# Patient Record
Sex: Male | Born: 1985 | Race: Black or African American | Hispanic: No | Smoking: Never smoker
Health system: Southern US, Community
[De-identification: ages and names within clinical notes are randomized; demographics above are authoritative.]

## PROBLEM LIST (undated history)

## (undated) HISTORY — PX: APPENDECTOMY: SHX54

---
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
## 2006-10-18 ENCOUNTER — Emergency Department (HOSPITAL_COMMUNITY): Admission: EM | Admit: 2006-10-18 | Discharge: 2006-10-18 | Payer: Self-pay | Admitting: Emergency Medicine

## 2012-03-13 ENCOUNTER — Emergency Department (HOSPITAL_COMMUNITY)
Admission: EM | Admit: 2012-03-13 | Discharge: 2012-03-13 | Disposition: A | Discharge status: Home / Self Care | Payer: Self-pay | Attending: Emergency Medicine | Admitting: Emergency Medicine

## 2012-03-13 ENCOUNTER — Emergency Department (HOSPITAL_COMMUNITY): Payer: Self-pay

## 2012-03-13 ENCOUNTER — Encounter (HOSPITAL_COMMUNITY): Payer: Self-pay | Admitting: *Deleted

## 2012-03-13 DIAGNOSIS — S41109A Unspecified open wound of unspecified upper arm, initial encounter: Secondary | ICD-10-CM | POA: Insufficient documentation

## 2012-03-13 DIAGNOSIS — M79609 Pain in unspecified limb: Secondary | ICD-10-CM | POA: Insufficient documentation

## 2012-03-13 DIAGNOSIS — R000 Tachycardia, unspecified: Secondary | ICD-10-CM | POA: Insufficient documentation

## 2012-03-13 DIAGNOSIS — S41111A Laceration without foreign body of right upper arm, initial encounter: Secondary | ICD-10-CM

## 2012-03-13 DIAGNOSIS — IMO0002 Reserved for concepts with insufficient information to code with codable children: Secondary | ICD-10-CM | POA: Insufficient documentation

## 2012-03-13 DIAGNOSIS — S41112A Laceration without foreign body of left upper arm, initial encounter: Secondary | ICD-10-CM

## 2012-03-13 DIAGNOSIS — S71009A Unspecified open wound, unspecified hip, initial encounter: Secondary | ICD-10-CM | POA: Insufficient documentation

## 2012-03-13 DIAGNOSIS — S71119A Laceration without foreign body, unspecified thigh, initial encounter: Secondary | ICD-10-CM

## 2012-03-13 DIAGNOSIS — S71109A Unspecified open wound, unspecified thigh, initial encounter: Secondary | ICD-10-CM | POA: Insufficient documentation

## 2012-03-13 MED ORDER — TETANUS-DIPHTH-ACELL PERTUSSIS 5-2.5-18.5 LF-MCG/0.5 IM SUSP
0.5000 mL | Freq: Once | INTRAMUSCULAR | Status: AC
  Administered 2012-03-13: 0.5 mL via INTRAMUSCULAR
  Filled 2012-03-13: qty 0.5

## 2012-03-13 MED ORDER — HYDROCODONE-ACETAMINOPHEN 5-325 MG PO TABS: 1.0000 | ORAL_TABLET | ORAL | Status: AC | PRN

## 2012-03-13 MED ORDER — HYDROCODONE-ACETAMINOPHEN 5-325 MG PO TABS
2.0000 | ORAL_TABLET | Freq: Once | ORAL | Status: AC
  Administered 2012-03-13: 2 via ORAL
  Filled 2012-03-13: qty 2

## 2012-03-13 NOTE — ED Notes (Signed)
Pt returned from xray.  Radiology states pt is asking to have dressing changed.

## 2012-03-13 NOTE — ED Provider Notes (Signed)
Patient with multiple lacerations initially evaluated by Dr. Oletta Lamas. Lacerations repaired as below. Xrays negative for foreign body. Discharge home.  LACERATION REPAIR Performed by: Langley Adie A Authorized by: Langley Adie A Consent: Verbal consent obtained. Risks and benefits: risks, benefits and alternatives were discussed Consent given by: patient Patient identity confirmed: provided demographic data Prepped and Draped in normal sterile fashion Wound explored  Laceration Location: right thigh  Laceration Length: 1.5cm  No Foreign Bodies seen or palpated  Anesthesia: local infiltration  Local anesthetic: lidocaine 1% w/ epinephrine  Anesthetic total: 1 ml  Irrigation method: syringe Amount of cleaning: standard  Skin closure: staple  Number of sutures: 2  Technique: staple  LACERATION REPAIR Performed by: Langley Adie A Authorized by: Langley Adie A Consent: Verbal consent obtained. Risks and benefits: risks, benefits and alternatives were discussed Consent given by: patient Patient identity confirmed: provided demographic data Prepped and Draped in normal sterile fashion Wound explored  Laceration Location: right shoulder  Laceration Length: 2cm  No Foreign Bodies seen or palpated  Anesthesia: local infiltration  Local anesthetic: lidocaine 1% w/ epinephrine  Anesthetic total: 1 ml  Irrigation method: syringe Amount of cleaning: standard  Skin closure: staple  Number of sutures: 3  Technique: staple  Patient tolerance: Patient tolerated the procedure well with no immediate complications.   Patient tolerance: Patient tolerated the procedure well with no immediate complications.   LACERATION REPAIR Performed by: Langley Adie A Authorized by: Langley Adie A Consent: Verbal consent obtained. Risks and benefits: risks, benefits and alternatives were discussed Consent given by: patient Patient identity confirmed: provided  demographic data Prepped and Draped in normal sterile fashion Wound explored  Laceration Location: right upper extremity  Laceration Length: 1cm  No Foreign Bodies seen or palpated  Anesthesia: local infiltration  Local anesthetic: lidocaine 1% w/ epinephrine  Anesthetic total: 1 ml  Irrigation method: syringe Amount of cleaning: standard  Skin closure: staple  Number of sutures: 1  Technique: staple  Patient tolerance: Patient tolerated the procedure well with no immediate complications.  LACERATION REPAIR Performed by: Langley Adie A Authorized by: Langley Adie A Consent: Verbal consent obtained. Risks and benefits: risks, benefits and alternatives were discussed Consent given by: patient Patient identity confirmed: provided demographic data Prepped and Draped in normal sterile fashion Wound explored  Laceration Location: left upper extremity  Laceration Length: 3cm  No Foreign Bodies seen or palpated  Anesthesia: local infiltration  Local anesthetic: lidocaine 1% w/ epinephrine  Anesthetic total: 2 ml  Irrigation method: syringe Amount of cleaning: standard  Skin closure: staple  Number of sutures: 4  Technique: staple  Patient tolerance: Patient tolerated the procedure well with no immediate complications.   Rodena Medin, PA-C 03/13/12 1548

## 2012-03-13 NOTE — Progress Notes (Signed)
Orthopedic Tech Progress Note Patient Details:  Gabriel Fox [birth year] [id number]  Other Ortho Devices Ortho Device Location: sling Ortho Device Interventions: Application   Cammer, Mickie Bail 03/13/2012, 4:32 PM
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
## 2012-03-13 NOTE — Discharge Instructions (Signed)
FOLLOW UP WITH YOUR DOCTOR OR RETURN HERE IN 10 DAYS FOR STAPLE REMOVAL. RETURN SOONER WITH ANY SIGN OF INFECTION. NORCO FOR PAIN.  Laceration Care, Adult A laceration is a cut or lesion that goes through all layers of the skin and into the tissue just beneath the skin. TREATMENT  Some lacerations may not require closure. Some lacerations may not be able to be closed due to an increased risk of infection. It is important to see your caregiver as soon as possible after an injury to minimize the risk of infection and maximize the opportunity for successful closure. If closure is appropriate, pain medicines may be given, if needed. The wound will be cleaned to help prevent infection. Your caregiver will use stitches (sutures), staples, wound glue (adhesive), or skin adhesive strips to repair the laceration. These tools bring the skin edges together to allow for faster healing and a better cosmetic outcome. However, all wounds will heal with a scar. Once the wound has healed, scarring can be minimized by covering the wound with sunscreen during the day for 1 full year. HOME CARE INSTRUCTIONS  For sutures or staples:  Keep the wound clean and dry.   If you were given a bandage (dressing), you should change it at least once a day. Also, change the dressing if it becomes wet or dirty, or as directed by your caregiver.   Wash the wound with soap and water 2 times a day. Rinse the wound off with water to remove all soap. Pat the wound dry with a clean towel.   After cleaning, apply a thin layer of the antibiotic ointment as recommended by your caregiver. This will help prevent infection and keep the dressing from sticking.   You may shower as usual after the first 24 hours. Do not soak the wound in water until the sutures are removed.   Only take over-the-counter or prescription medicines for pain, discomfort, or fever as directed by your caregiver.   Get your sutures or staples removed as directed by  your caregiver.  For skin adhesive strips:  Keep the wound clean and dry.   Do not get the skin adhesive strips wet. You may bathe carefully, using caution to keep the wound dry.   If the wound gets wet, pat it dry with a clean towel.   Skin adhesive strips will fall off on their own. You may trim the strips as the wound heals. Do not remove skin adhesive strips that are still stuck to the wound. They will fall off in time.  For wound adhesive:  You may briefly wet your wound in the shower or bath. Do not soak or scrub the wound. Do not swim. Avoid periods of heavy perspiration until the skin adhesive has fallen off on its own. After showering or bathing, gently pat the wound dry with a clean towel.   Do not apply liquid medicine, cream medicine, or ointment medicine to your wound while the skin adhesive is in place. This may loosen the film before your wound is healed.   If a dressing is placed over the wound, be careful not to apply tape directly over the skin adhesive. This may cause the adhesive to be pulled off before the wound is healed.   Avoid prolonged exposure to sunlight or tanning lamps while the skin adhesive is in place. Exposure to ultraviolet light in the first year will darken the scar.   The skin adhesive will usually remain in place for 5 to  10 days, then naturally fall off the skin. Do not pick at the adhesive film.  You may need a tetanus shot if:  You cannot remember when you had your last tetanus shot.   You have never had a tetanus shot.  If you get a tetanus shot, your arm may swell, get red, and feel warm to the touch. This is common and not a problem. If you need a tetanus shot and you choose not to have one, there is a rare chance of getting tetanus. Sickness from tetanus can be serious. SEEK MEDICAL CARE IF:   You have redness, swelling, or increasing pain in the wound.   You see a red line that goes away from the wound.   You have yellowish-white fluid  (pus) coming from the wound.   You have a fever.   You notice a bad smell coming from the wound or dressing.   Your wound breaks open before or after sutures have been removed.   You notice something coming out of the wound such as wood or glass.   Your wound is on your hand or foot and you cannot move a finger or toe.  SEEK IMMEDIATE MEDICAL CARE IF:   Your pain is not controlled with prescribed medicine.   You have severe swelling around the wound causing pain and numbness or a change in color in your arm, hand, leg, or foot.   Your wound splits open and starts bleeding.   You have worsening numbness, weakness, or loss of function of any joint around or beyond the wound.   You develop painful lumps near the wound or on the skin anywhere on your body.  MAKE SURE YOU:   Understand these instructions.   Will watch your condition.   Will get help right away if you are not doing well or get worse.  Document Released: 10/23/2005 Document Revised: 10/12/2011 Document Reviewed: 04/18/2011 [facility] Patient Information 2012 [place], [place].
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
## 2012-03-13 NOTE — ED Provider Notes (Signed)
  Medical screening examination/treatment/procedure(s) were conducted as a shared visit with non-physician practitioner(s) and myself.  I personally evaluated the patient during the encounter   Please see my original note.    Gabriel Fox. Jessamine Barcia, MD 03/13/12 1555
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
## 2012-04-02 ENCOUNTER — Emergency Department (INDEPENDENT_AMBULATORY_CARE_PROVIDER_SITE_OTHER)
Admission: EM | Admit: 2012-04-02 | Discharge: 2012-04-02 | Disposition: A | Discharge status: Home / Self Care | Payer: Self-pay | Source: Home / Self Care | Attending: Emergency Medicine | Admitting: Emergency Medicine

## 2012-04-02 ENCOUNTER — Encounter (HOSPITAL_COMMUNITY): Payer: Self-pay | Admitting: Emergency Medicine

## 2012-04-02 DIAGNOSIS — Z4802 Encounter for removal of sutures: Secondary | ICD-10-CM

## 2012-04-02 MED ORDER — BACITRACIN-NEOMYCIN-POLYMYXIN OINTMENT TUBE
TOPICAL_OINTMENT | Freq: Once | CUTANEOUS | Status: AC
  Administered 2012-04-02: 13:00:00 via TOPICAL

## 2012-04-02 MED ORDER — BACITRACIN ZINC 500 UNIT/GM EX OINT: TOPICAL_OINTMENT | Freq: Two times a day (BID) | CUTANEOUS | Status: AC

## 2012-04-02 NOTE — ED Notes (Signed)
Pt here for staple removal right upper arm s/p laceration 14 dys.little bleeding noted but no redness or swelling

## 2012-04-02 NOTE — ED Provider Notes (Signed)
History     CSN: [id number]  Arrival date & time 04/02/12  1109   First MD Initiated Contact with Patient 04/02/12 1116      Chief Complaint  Patient presents with  . Suture / Staple Removal    (Consider location/radiation/quality/duration/timing/severity/associated sxs/prior treatment) HPI Comments: Patient presents here approximately 14 days after having had 3 distinctive wounds repaired with staples. 2 of them on his right side, upper right shoulder, and right inner thigh area. The third repair laceration sinus left arm. Will start not draining any material and there's no increased swelling or redness around them. He waited a little longer before having them removed as per her choice, as he "wanted more days so will heal better"  Patient is a 26 y.o. male presenting with suture removal. The history is provided by the patient.  Suture / Staple Removal  The sutures were placed more than 14 days ago. There has been no drainage from the wound. There is no redness present. There is no swelling present. The pain has no pain. He has no difficulty moving the affected extremity or digit.    History reviewed. No pertinent past medical history.  History reviewed. No pertinent past surgical history.  No family history on file.  History  Substance Use Topics  . Smoking status: Not on file  . Smokeless tobacco: Not on file  . Alcohol Use: No      Review of Systems  Constitutional: Negative for fever, chills and appetite change.  Musculoskeletal: Negative for myalgias, joint swelling, arthralgias and gait problem.  Skin: Positive for wound.    Allergies  Review of patient's allergies indicates no known allergies.  Home Medications   Current Outpatient Rx  Name Route Sig Dispense Refill  . BACITRACIN ZINC 500 UNIT/GM EX OINT Topical Apply topically 2 (two) times daily. X 7 days 120 g 0  . BENADRYL ALLERGY PO Oral Take 1 capsule by mouth 2 (two) times daily as needed. allergies       BP 110/61  Pulse 78  Temp(Src) 97.5 F (36.4 C) (Oral)  Resp 14  SpO2 98%  Physical Exam  Nursing note and vitals reviewed. Constitutional: He appears well-developed and well-nourished.  Skin: Skin is warm.       ED Course  SUTURE REMOVAL Performed by: Irven Ingalsbe Authorized by: Jimmie Molly Consent: Verbal consent obtained. Written consent not obtained. Patient understanding: patient states understanding of the procedure being performed Patient identity confirmed: verbally with patient Body area: lower extremity Wound Appearance: clean Facility: sutures placed in this facility Comments: Staples removed from 3 distinctive regions. See illustration for details   (including critical care time)  Labs Reviewed - No data to display No results found.   1. Encounter for removal of sutures       MDM  Uncomplicated staple removal.        Jimmie Molly, MD 04/02/12 1410

## 2012-04-02 NOTE — ED Notes (Signed)
Bacitracin and bandaid applied to all 3 wounds, left forearm, right upper arm and right leg.

## 2012-04-02 NOTE — Discharge Instructions (Signed)
Staple Removal  Care After  The staples used to close your skin have been removed. The wound needs continued care so it can heal completely and without problems. The care described here will need to be done for another 5-10 days unless your caregiver advises otherwise.   HOME CARE INSTRUCTIONS    Keep wound site dry and clean.   If skin adhesive strips were applied after the staples were removed, they will begin to peel off in a few days. If they remain after fourteen days, they may be peeled off and discarded.   If you still have a dressing, change it at least once a day or as instructed by your caregiver. If the bandage sticks, soak it off with warm water. Pat dry with a clean towel. Look for signs of infection (see below).   Reapply cream or ointment according to your caregiver's instruction. This will help prevent infection and keep the bandage from sticking. Use of a non-stick material over the wound and under the dressing or wrap will also help keep the bandage from sticking.   If the bandage becomes wet, dirty or develops a foul smell, change it as soon as possible.   New scars become sunburned easily. Use sunscreens with protection factor (SPF) of at least 15 when out in the sun.   Only take over-the-counter or prescription medicines for pain, discomfort or fever as directed by your caregiver.  SEEK IMMEDIATE MEDICAL CARE IF:    There is redness, swelling or increasing pain in the wound.   Pus is coming from the wound.   An unexplained oral temperature above 102 F (38.9 C) develops.   You notice a foul smell coming from the wound or dressing.   There is a breaking open of the suture line (edges not staying together) of the wound edges after staples have been removed.  Document Released: 10/05/2008 Document Revised: 10/12/2011 Document Reviewed: 10/05/2008  [employer] Patient Information 2012 [employer].

## 2012-06-29 ENCOUNTER — Encounter (HOSPITAL_COMMUNITY): Payer: Self-pay | Admitting: Family Medicine

## 2012-06-29 ENCOUNTER — Emergency Department (HOSPITAL_COMMUNITY): Payer: Self-pay

## 2012-06-29 ENCOUNTER — Emergency Department (HOSPITAL_COMMUNITY)
Admission: EM | Admit: 2012-06-29 | Discharge: 2012-06-29 | Disposition: A | Discharge status: Home / Self Care | Payer: Self-pay | Attending: Emergency Medicine | Admitting: Emergency Medicine

## 2012-06-29 DIAGNOSIS — IMO0002 Reserved for concepts with insufficient information to code with codable children: Secondary | ICD-10-CM | POA: Insufficient documentation

## 2012-06-29 DIAGNOSIS — S62339A Displaced fracture of neck of unspecified metacarpal bone, initial encounter for closed fracture: Secondary | ICD-10-CM | POA: Insufficient documentation

## 2012-06-29 DIAGNOSIS — F17200 Nicotine dependence, unspecified, uncomplicated: Secondary | ICD-10-CM | POA: Insufficient documentation

## 2012-06-29 DIAGNOSIS — S62309A Unspecified fracture of unspecified metacarpal bone, initial encounter for closed fracture: Secondary | ICD-10-CM

## 2012-06-29 DIAGNOSIS — W208XXA Other cause of strike by thrown, projected or falling object, initial encounter: Secondary | ICD-10-CM | POA: Insufficient documentation

## 2012-06-29 MED ORDER — IBUPROFEN 400 MG PO TABS
800.0000 mg | ORAL_TABLET | Freq: Once | ORAL | Status: AC
  Administered 2012-06-29: 800 mg via ORAL
  Filled 2012-06-29: qty 2

## 2012-06-29 MED ORDER — OXYCODONE-ACETAMINOPHEN 5-325 MG PO TABS: 2.0000 | ORAL_TABLET | ORAL | Status: AC | PRN

## 2012-06-29 NOTE — Progress Notes (Signed)
Orthopedic Tech Progress Note Patient Details:  DRU PRIMEAU 22-Sep-1986 [id number]  Ortho Devices Type of Ortho Device: Ulna gutter splint;Arm foam sling Ortho Device/Splint Interventions: Application   Cammer, Mickie Bail 06/29/2012, 12:14 PM
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
## 2012-07-28 ENCOUNTER — Emergency Department (HOSPITAL_COMMUNITY)
Admission: EM | Admit: 2012-07-28 | Discharge: 2012-07-28 | Disposition: A | Discharge status: Home / Self Care | Payer: No Typology Code available for payment source | Attending: Emergency Medicine | Admitting: Emergency Medicine

## 2012-07-28 DIAGNOSIS — F17200 Nicotine dependence, unspecified, uncomplicated: Secondary | ICD-10-CM | POA: Insufficient documentation

## 2012-07-28 DIAGNOSIS — Z049 Encounter for examination and observation for unspecified reason: Secondary | ICD-10-CM | POA: Insufficient documentation

## 2012-07-28 DIAGNOSIS — Y92410 Unspecified street and highway as the place of occurrence of the external cause: Secondary | ICD-10-CM | POA: Insufficient documentation

## 2012-07-28 MED ORDER — OXYCODONE-ACETAMINOPHEN 5-325 MG PO TABS: 1.0000 | ORAL_TABLET | Freq: Once | ORAL | Status: DC

## 2012-07-28 MED ORDER — OXYCODONE-ACETAMINOPHEN 5-325 MG PO TABS: 1.0000 | ORAL_TABLET | Freq: Four times a day (QID) | ORAL | Status: DC | PRN

## 2012-07-28 MED ORDER — OXYCODONE-ACETAMINOPHEN 5-325 MG PO TABS
1.0000 | ORAL_TABLET | Freq: Once | ORAL | Status: AC
  Administered 2012-07-28: 1 via ORAL
  Filled 2012-07-28: qty 1

## 2012-07-28 MED ORDER — ONDANSETRON 4 MG PO TBDP: 4.0000 mg | ORAL_TABLET | Freq: Once | ORAL | Status: DC

## 2012-07-28 MED ORDER — ONDANSETRON 4 MG PO TBDP
4.0000 mg | ORAL_TABLET | Freq: Once | ORAL | Status: AC
  Administered 2012-07-28: 4 mg via ORAL
  Filled 2012-07-28: qty 1

## 2012-07-28 MED ORDER — ONDANSETRON HCL 4 MG PO TABS: 4.0000 mg | ORAL_TABLET | Freq: Four times a day (QID) | ORAL | Status: DC

## 2012-07-28 NOTE — ED Provider Notes (Signed)
Medical screening examination/treatment/procedure(s) were performed by non-physician practitioner and as supervising physician I was immediately available for consultation/collaboration.   Richardean Canal, MD 07/28/12 2146
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
## 2012-07-28 NOTE — ED Notes (Signed)
WUJ:[id number]<YN> Expected date:<BR> Expected time:<BR> Means of arrival:<BR> Comments:<BR> Hold for [facility] BB/CC
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
## 2017-03-28 ENCOUNTER — Emergency Department (HOSPITAL_COMMUNITY)
Admission: EM | Admit: 2017-03-28 | Discharge: 2017-03-28 | Disposition: A | Discharge status: Home / Self Care | Payer: Self-pay | Attending: Emergency Medicine | Admitting: Emergency Medicine

## 2017-03-28 ENCOUNTER — Encounter (HOSPITAL_COMMUNITY): Payer: Self-pay

## 2017-03-28 ENCOUNTER — Emergency Department (HOSPITAL_COMMUNITY): Payer: Self-pay

## 2017-03-28 DIAGNOSIS — S6991XA Unspecified injury of right wrist, hand and finger(s), initial encounter: Secondary | ICD-10-CM

## 2017-03-28 DIAGNOSIS — F17200 Nicotine dependence, unspecified, uncomplicated: Secondary | ICD-10-CM | POA: Insufficient documentation

## 2017-03-28 DIAGNOSIS — W268XXA Contact with other sharp object(s), not elsewhere classified, initial encounter: Secondary | ICD-10-CM | POA: Insufficient documentation

## 2017-03-28 DIAGNOSIS — Z23 Encounter for immunization: Secondary | ICD-10-CM | POA: Insufficient documentation

## 2017-03-28 DIAGNOSIS — Y92810 Car as the place of occurrence of the external cause: Secondary | ICD-10-CM | POA: Insufficient documentation

## 2017-03-28 DIAGNOSIS — Y9389 Activity, other specified: Secondary | ICD-10-CM | POA: Insufficient documentation

## 2017-03-28 DIAGNOSIS — Y999 Unspecified external cause status: Secondary | ICD-10-CM | POA: Insufficient documentation

## 2017-03-28 DIAGNOSIS — S61411A Laceration without foreign body of right hand, initial encounter: Secondary | ICD-10-CM | POA: Insufficient documentation

## 2017-03-28 MED ORDER — ACETAMINOPHEN 500 MG PO TABS
1000.0000 mg | ORAL_TABLET | Freq: Once | ORAL | Status: AC
  Administered 2017-03-28: 1000 mg via ORAL
  Filled 2017-03-28: qty 2

## 2017-03-28 MED ORDER — LIDOCAINE-EPINEPHRINE (PF) 2 %-1:200000 IJ SOLN
10.0000 mL | Freq: Once | INTRAMUSCULAR | Status: AC
  Administered 2017-03-28: 10 mL via INTRADERMAL
  Filled 2017-03-28: qty 20

## 2017-03-28 MED ORDER — TETANUS-DIPHTH-ACELL PERTUSSIS 5-2.5-18.5 LF-MCG/0.5 IM SUSP
0.5000 mL | Freq: Once | INTRAMUSCULAR | Status: AC
  Administered 2017-03-28: 0.5 mL via INTRAMUSCULAR
  Filled 2017-03-28: qty 0.5

## 2017-03-28 NOTE — ED Triage Notes (Signed)
Per Pt, Pt was working on a car when the hood of the car fell and lacerated his right hand. Bleeding controlled.

## 2017-03-28 NOTE — Discharge Instructions (Signed)
Follow-up in the ED, or urgent care or your primary care in 14 days for suture removal. Return earlier if any concerning symptoms develop. Follow-up with orthopedic hand of your pain or numbness persist. Alternate ibuprofen and Tylenol every 3 hours for the next few days for pain control. Apply ice or heat to the hand for comfort as well.
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
## 2017-03-28 NOTE — ED Notes (Signed)
Patients hand placed in iodine and sterile water solution.

## 2017-03-28 NOTE — ED Notes (Signed)
Patient transported to X-ray 

## 2017-05-23 ENCOUNTER — Emergency Department (HOSPITAL_COMMUNITY): Payer: Self-pay

## 2017-05-23 ENCOUNTER — Ambulatory Visit (HOSPITAL_COMMUNITY)
Admission: EM | Admit: 2017-05-23 | Discharge: 2017-05-23 | Disposition: A | Discharge status: Home / Self Care | Payer: Self-pay

## 2017-05-23 ENCOUNTER — Encounter (HOSPITAL_COMMUNITY): Payer: Self-pay | Admitting: Nurse Practitioner

## 2017-05-23 ENCOUNTER — Inpatient Hospital Stay (HOSPITAL_COMMUNITY)
Admission: EM | Admit: 2017-05-23 | Discharge: 2017-05-28 | DRG: 027 | Disposition: A | Discharge status: Home / Self Care | Payer: Self-pay | Attending: Neurosurgery | Admitting: Neurosurgery

## 2017-05-23 DIAGNOSIS — F17200 Nicotine dependence, unspecified, uncomplicated: Secondary | ICD-10-CM | POA: Diagnosis present

## 2017-05-23 DIAGNOSIS — R402253 Coma scale, best verbal response, oriented, at hospital admission: Secondary | ICD-10-CM | POA: Diagnosis present

## 2017-05-23 DIAGNOSIS — S065XAA Traumatic subdural hemorrhage with loss of consciousness status unknown, initial encounter: Secondary | ICD-10-CM

## 2017-05-23 DIAGNOSIS — R519 Headache, unspecified: Secondary | ICD-10-CM

## 2017-05-23 DIAGNOSIS — W010XXA Fall on same level from slipping, tripping and stumbling without subsequent striking against object, initial encounter: Secondary | ICD-10-CM | POA: Diagnosis present

## 2017-05-23 DIAGNOSIS — R402363 Coma scale, best motor response, obeys commands, at hospital admission: Secondary | ICD-10-CM | POA: Diagnosis present

## 2017-05-23 DIAGNOSIS — R51 Headache: Secondary | ICD-10-CM

## 2017-05-23 DIAGNOSIS — S065X9A Traumatic subdural hemorrhage with loss of consciousness of unspecified duration, initial encounter: Principal | ICD-10-CM | POA: Diagnosis present

## 2017-05-23 DIAGNOSIS — R402143 Coma scale, eyes open, spontaneous, at hospital admission: Secondary | ICD-10-CM | POA: Diagnosis present

## 2017-05-23 DIAGNOSIS — J020 Streptococcal pharyngitis: Secondary | ICD-10-CM | POA: Diagnosis present

## 2017-05-23 LAB — CBC
HCT: 40.5 % (ref 39.0–52.0)
Hemoglobin: 13 g/dL (ref 13.0–17.0)
MCH: 24.4 pg — ABNORMAL LOW (ref 26.0–34.0)
MCHC: 32.1 g/dL (ref 30.0–36.0)
MCV: 76 fL — ABNORMAL LOW (ref 78.0–100.0)
Platelets: 181 10*3/uL (ref 150–400)
RBC: 5.33 MIL/uL (ref 4.22–5.81)
RDW: 14.2 % (ref 11.5–15.5)
WBC: 11.8 10*3/uL — ABNORMAL HIGH (ref 4.0–10.5)

## 2017-05-23 LAB — COMPREHENSIVE METABOLIC PANEL
ALT: 24 U/L (ref 17–63)
AST: 22 U/L (ref 15–41)
Albumin: 4.3 g/dL (ref 3.5–5.0)
Alkaline Phosphatase: 50 U/L (ref 38–126)
Anion gap: 9 (ref 5–15)
BUN: 8 mg/dL (ref 6–20)
CO2: 21 mmol/L — ABNORMAL LOW (ref 22–32)
Calcium: 9.1 mg/dL (ref 8.9–10.3)
Chloride: 103 mmol/L (ref 101–111)
Creatinine, Ser: 1.17 mg/dL (ref 0.61–1.24)
GFR calc Af Amer: >60 mL/min (ref >60)
GFR calc non Af Amer: >60 mL/min (ref >60)
Glucose, Bld: 101 mg/dL — ABNORMAL HIGH (ref 65–99)
Potassium: 3.9 mmol/L (ref 3.5–5.1)
Sodium: 133 mmol/L — ABNORMAL LOW (ref 135–145)
Total Bilirubin: 1 mg/dL (ref 0.3–1.2)
Total Protein: 7.3 g/dL (ref 6.5–8.1)

## 2017-05-23 LAB — URINALYSIS, ROUTINE W REFLEX MICROSCOPIC
Bilirubin Urine: NEGATIVE
Glucose, UA: NEGATIVE mg/dL
Hgb urine dipstick: NEGATIVE
Ketones, ur: NEGATIVE mg/dL
Leukocytes, UA: NEGATIVE
Nitrite: NEGATIVE
Protein, ur: NEGATIVE mg/dL
Specific Gravity, Urine: 1.016 (ref 1.005–1.030)
pH: 5 (ref 5.0–8.0)

## 2017-05-23 MED ORDER — SODIUM CHLORIDE 0.9 % IV BOLUS (SEPSIS)
1000.0000 mL | Freq: Once | INTRAVENOUS | Status: AC
  Administered 2017-05-24: 1000 mL via INTRAVENOUS

## 2017-05-23 NOTE — ED Notes (Addendum)
Pt states he has had a headache since last night. Pt not able to get relief and had to leave work.

## 2017-05-23 NOTE — ED Notes (Signed)
Patient transported to CT 

## 2017-05-23 NOTE — ED Triage Notes (Addendum)
Pt presents with c/o headache, sore throat, diarrhea. His symptoms began when he woke this morning. He tried dayquil with no relief. He slipped and fell [place] a water park this past Sunday.

## 2017-05-24 ENCOUNTER — Inpatient Hospital Stay (HOSPITAL_COMMUNITY): Payer: Self-pay | Admitting: Certified Registered"

## 2017-05-24 ENCOUNTER — Encounter (HOSPITAL_COMMUNITY): Payer: Self-pay | Admitting: *Deleted

## 2017-05-24 ENCOUNTER — Emergency Department (HOSPITAL_COMMUNITY): Payer: Self-pay

## 2017-05-24 ENCOUNTER — Encounter (HOSPITAL_COMMUNITY)
Admission: EM | Disposition: A | Discharge status: Home / Self Care | Payer: Self-pay | Source: Home / Self Care | Attending: Neurosurgery

## 2017-05-24 DIAGNOSIS — S065XAA Traumatic subdural hemorrhage with loss of consciousness status unknown, initial encounter: Secondary | ICD-10-CM | POA: Diagnosis present

## 2017-05-24 DIAGNOSIS — S065X9A Traumatic subdural hemorrhage with loss of consciousness of unspecified duration, initial encounter: Secondary | ICD-10-CM | POA: Diagnosis present

## 2017-05-24 HISTORY — PX: BURR HOLE: SHX908

## 2017-05-24 LAB — CBC
HCT: 41.2 % (ref 39.0–52.0)
Hemoglobin: 13.1 g/dL (ref 13.0–17.0)
MCH: 24.3 pg — ABNORMAL LOW (ref 26.0–34.0)
MCHC: 31.8 g/dL (ref 30.0–36.0)
MCV: 76.4 fL — ABNORMAL LOW (ref 78.0–100.0)
Platelets: 178 10*3/uL (ref 150–400)
RBC: 5.39 MIL/uL (ref 4.22–5.81)
RDW: 14.3 % (ref 11.5–15.5)
WBC: 13.2 10*3/uL — ABNORMAL HIGH (ref 4.0–10.5)

## 2017-05-24 LAB — BASIC METABOLIC PANEL
Anion gap: 8 (ref 5–15)
BUN: 8 mg/dL (ref 6–20)
CO2: 22 mmol/L (ref 22–32)
Calcium: 9 mg/dL (ref 8.9–10.3)
Chloride: 105 mmol/L (ref 101–111)
Creatinine, Ser: 1.22 mg/dL (ref 0.61–1.24)
GFR calc Af Amer: >60 mL/min (ref >60)
GFR calc non Af Amer: >60 mL/min (ref >60)
Glucose, Bld: 129 mg/dL — ABNORMAL HIGH (ref 65–99)
Potassium: 4.6 mmol/L (ref 3.5–5.1)
Sodium: 135 mmol/L (ref 135–145)

## 2017-05-24 LAB — RAPID STREP SCREEN (MED CTR MEBANE ONLY): Streptococcus, Group A Screen (Direct): POSITIVE — AB

## 2017-05-24 SURGERY — CREATION, CRANIAL BURR HOLE
Anesthesia: General | Site: Head | Laterality: Left

## 2017-05-24 MED ORDER — LACTATED RINGERS IV SOLN
INTRAVENOUS | Status: DC
  Administered 2017-05-24: 17:00:00 via INTRAVENOUS

## 2017-05-24 MED ORDER — HYDROMORPHONE HCL 1 MG/ML IJ SOLN: 0.2500 mg | INTRAMUSCULAR | Status: DC | PRN

## 2017-05-24 MED ORDER — SUGAMMADEX SODIUM 200 MG/2ML IV SOLN
INTRAVENOUS | Status: DC | PRN
  Administered 2017-05-24: 200 mg via INTRAVENOUS

## 2017-05-24 MED ORDER — LACTATED RINGERS IV SOLN
INTRAVENOUS | Status: DC | PRN
  Administered 2017-05-24: 19:00:00 via INTRAVENOUS

## 2017-05-24 MED ORDER — ACETAMINOPHEN 160 MG/5ML PO SOLN: 650.0000 mg | ORAL | Status: DC | PRN

## 2017-05-24 MED ORDER — DOCUSATE SODIUM 100 MG PO CAPS
100.0000 mg | ORAL_CAPSULE | Freq: Two times a day (BID) | ORAL | Status: DC
  Administered 2017-05-25 – 2017-05-28 (×6): 100 mg via ORAL
  Filled 2017-05-24 (×7): qty 1

## 2017-05-24 MED ORDER — PROMETHAZINE HCL 25 MG PO TABS: 12.5000 mg | ORAL_TABLET | ORAL | Status: DC | PRN

## 2017-05-24 MED ORDER — CEFAZOLIN SODIUM-DEXTROSE 2-4 GM/100ML-% IV SOLN
INTRAVENOUS | Status: AC
  Filled 2017-05-24: qty 100

## 2017-05-24 MED ORDER — PROPOFOL 10 MG/ML IV BOLUS
INTRAVENOUS | Status: AC
  Filled 2017-05-24: qty 20

## 2017-05-24 MED ORDER — ONDANSETRON HCL 4 MG/2ML IJ SOLN: 4.0000 mg | Freq: Once | INTRAMUSCULAR | Status: DC | PRN

## 2017-05-24 MED ORDER — ONDANSETRON HCL 4 MG PO TABS: 4.0000 mg | ORAL_TABLET | ORAL | Status: DC | PRN

## 2017-05-24 MED ORDER — ACETAMINOPHEN 10 MG/ML IV SOLN
INTRAVENOUS | Status: DC | PRN
  Administered 2017-05-24: 1000 mg via INTRAVENOUS

## 2017-05-24 MED ORDER — CEFAZOLIN SODIUM-DEXTROSE 2-4 GM/100ML-% IV SOLN
2.0000 g | Freq: Three times a day (TID) | INTRAVENOUS | Status: AC
  Administered 2017-05-25 (×2): 2 g via INTRAVENOUS
  Filled 2017-05-24 (×2): qty 100

## 2017-05-24 MED ORDER — ONDANSETRON HCL 4 MG/2ML IJ SOLN
4.0000 mg | INTRAMUSCULAR | Status: DC | PRN
  Administered 2017-05-25: 4 mg via INTRAVENOUS
  Filled 2017-05-24: qty 2

## 2017-05-24 MED ORDER — ROCURONIUM BROMIDE 100 MG/10ML IV SOLN
INTRAVENOUS | Status: DC | PRN
  Administered 2017-05-24: 50 mg via INTRAVENOUS

## 2017-05-24 MED ORDER — PROPOFOL 10 MG/ML IV BOLUS
INTRAVENOUS | Status: DC | PRN
  Administered 2017-05-24: 120 mg via INTRAVENOUS

## 2017-05-24 MED ORDER — THROMBIN 5000 UNITS EX SOLR
CUTANEOUS | Status: AC
  Filled 2017-05-24: qty 10000

## 2017-05-24 MED ORDER — BACITRACIN ZINC 500 UNIT/GM EX OINT
TOPICAL_OINTMENT | CUTANEOUS | Status: AC
  Filled 2017-05-24: qty 28.35

## 2017-05-24 MED ORDER — DEXAMETHASONE SODIUM PHOSPHATE 10 MG/ML IJ SOLN
INTRAMUSCULAR | Status: DC | PRN
  Administered 2017-05-24: 10 mg via INTRAVENOUS

## 2017-05-24 MED ORDER — THROMBIN 20000 UNITS EX SOLR
CUTANEOUS | Status: AC
  Filled 2017-05-24: qty 20000

## 2017-05-24 MED ORDER — ACETAMINOPHEN 325 MG PO TABS: 650.0000 mg | ORAL_TABLET | ORAL | Status: DC | PRN

## 2017-05-24 MED ORDER — FENTANYL CITRATE (PF) 100 MCG/2ML IJ SOLN
INTRAMUSCULAR | Status: DC | PRN
  Administered 2017-05-24 (×2): 100 ug via INTRAVENOUS
  Administered 2017-05-24: 50 ug via INTRAVENOUS

## 2017-05-24 MED ORDER — DEXMEDETOMIDINE HCL 200 MCG/2ML IV SOLN
INTRAVENOUS | Status: DC | PRN
  Administered 2017-05-24 (×2): 8 ug via INTRAVENOUS

## 2017-05-24 MED ORDER — ACETAMINOPHEN 650 MG RE SUPP: 650.0000 mg | RECTAL | Status: DC | PRN

## 2017-05-24 MED ORDER — POTASSIUM CHLORIDE IN NACL 20-0.9 MEQ/L-% IV SOLN
INTRAVENOUS | Status: DC
  Administered 2017-05-24: 23:00:00 via INTRAVENOUS
  Filled 2017-05-24 (×2): qty 1000

## 2017-05-24 MED ORDER — ACETAMINOPHEN 10 MG/ML IV SOLN
INTRAVENOUS | Status: AC
  Filled 2017-05-24: qty 100

## 2017-05-24 MED ORDER — HYDROCODONE-ACETAMINOPHEN 5-325 MG PO TABS
1.0000 | ORAL_TABLET | ORAL | Status: DC | PRN
  Administered 2017-05-25 – 2017-05-28 (×6): 1 via ORAL
  Filled 2017-05-24 (×6): qty 1

## 2017-05-24 MED ORDER — LIDOCAINE-EPINEPHRINE 1 %-1:100000 IJ SOLN
INTRAMUSCULAR | Status: DC | PRN
  Administered 2017-05-24: 5 mL via INTRADERMAL

## 2017-05-24 MED ORDER — ONDANSETRON HCL 4 MG/2ML IJ SOLN
INTRAMUSCULAR | Status: DC | PRN
  Administered 2017-05-24: 4 mg via INTRAVENOUS

## 2017-05-24 MED ORDER — SODIUM CHLORIDE 0.9 % IV SOLN: INTRAVENOUS | Status: DC

## 2017-05-24 MED ORDER — VANCOMYCIN HCL 1000 MG IV SOLR
INTRAVENOUS | Status: AC
  Filled 2017-05-24: qty 1000

## 2017-05-24 MED ORDER — 0.9 % SODIUM CHLORIDE (POUR BTL) OPTIME
TOPICAL | Status: DC | PRN
  Administered 2017-05-24 (×3): 1000 mL

## 2017-05-24 MED ORDER — PENICILLIN G BENZATHINE 1200000 UNIT/2ML IM SUSP
1.2000 10*6.[IU] | Freq: Once | INTRAMUSCULAR | Status: AC
  Administered 2017-05-24: 1.2 10*6.[IU] via INTRAMUSCULAR
  Filled 2017-05-24: qty 2

## 2017-05-24 MED ORDER — MIDAZOLAM HCL 5 MG/5ML IJ SOLN
INTRAMUSCULAR | Status: DC | PRN
  Administered 2017-05-24: 2 mg via INTRAVENOUS

## 2017-05-24 MED ORDER — FENTANYL CITRATE (PF) 250 MCG/5ML IJ SOLN
INTRAMUSCULAR | Status: AC
  Filled 2017-05-24: qty 5

## 2017-05-24 MED ORDER — HYDROMORPHONE HCL 1 MG/ML IJ SOLN
0.5000 mg | INTRAMUSCULAR | Status: DC | PRN
  Administered 2017-05-24 – 2017-05-25 (×4): 0.5 mg via INTRAVENOUS
  Administered 2017-05-25: 1 mg via INTRAVENOUS
  Filled 2017-05-24: qty 1
  Filled 2017-05-24 (×4): qty 0.5

## 2017-05-24 MED ORDER — LIDOCAINE-EPINEPHRINE 1 %-1:100000 IJ SOLN
INTRAMUSCULAR | Status: AC
  Filled 2017-05-24: qty 1

## 2017-05-24 MED ORDER — CEFAZOLIN SODIUM-DEXTROSE 2-3 GM-% IV SOLR
INTRAVENOUS | Status: DC | PRN
  Administered 2017-05-24: 2 g via INTRAVENOUS

## 2017-05-24 MED ORDER — SODIUM CHLORIDE 0.9 % IR SOLN
Status: DC | PRN
  Administered 2017-05-24: 500 mL

## 2017-05-24 MED ORDER — ROCURONIUM BROMIDE 50 MG/5ML IV SOLN
INTRAVENOUS | Status: AC
  Filled 2017-05-24: qty 3

## 2017-05-24 MED ORDER — SENNOSIDES-DOCUSATE SODIUM 8.6-50 MG PO TABS
1.0000 | ORAL_TABLET | Freq: Two times a day (BID) | ORAL | Status: DC
  Administered 2017-05-25 – 2017-05-28 (×6): 1 via ORAL
  Filled 2017-05-24 (×7): qty 1

## 2017-05-24 MED ORDER — MORPHINE SULFATE (PF) 4 MG/ML IV SOLN
1.0000 mg | INTRAVENOUS | Status: DC | PRN
Start: 2017-05-24 — End: 2017-05-24

## 2017-05-24 MED ORDER — LIDOCAINE HCL (CARDIAC) 20 MG/ML IV SOLN
INTRAVENOUS | Status: DC | PRN
  Administered 2017-05-24: 100 mg via INTRAVENOUS

## 2017-05-24 MED ORDER — MEPERIDINE HCL 25 MG/ML IJ SOLN: 6.2500 mg | INTRAMUSCULAR | Status: DC | PRN

## 2017-05-24 MED ORDER — GADOBENATE DIMEGLUMINE 529 MG/ML IV SOLN
15.0000 mL | Freq: Once | INTRAVENOUS | Status: AC | PRN
  Administered 2017-05-24: 15 mL via INTRAVENOUS

## 2017-05-24 MED ORDER — MIDAZOLAM HCL 2 MG/2ML IJ SOLN
INTRAMUSCULAR | Status: AC
  Filled 2017-05-24: qty 2

## 2017-05-24 MED ORDER — PANTOPRAZOLE SODIUM 40 MG IV SOLR
40.0000 mg | Freq: Every day | INTRAVENOUS | Status: DC
  Filled 2017-05-24: qty 40

## 2017-05-24 MED ORDER — THROMBIN 20000 UNITS EX SOLR
CUTANEOUS | Status: DC | PRN
  Administered 2017-05-24: 20 mL via TOPICAL

## 2017-05-24 MED ORDER — TRAMADOL HCL 50 MG PO TABS: 50.0000 mg | ORAL_TABLET | Freq: Four times a day (QID) | ORAL | Status: DC | PRN

## 2017-05-24 MED ORDER — PHENYLEPHRINE HCL 10 MG/ML IJ SOLN
INTRAMUSCULAR | Status: DC | PRN
  Administered 2017-05-24 (×2): 80 ug via INTRAVENOUS
  Administered 2017-05-24: 120 ug via INTRAVENOUS
  Administered 2017-05-24: 80 ug via INTRAVENOUS

## 2017-05-24 SURGICAL SUPPLY — 76 items
BAG DECANTER FOR FLEXI CONT (MISCELLANEOUS) ×2 IMPLANT
BANDAGE GAUZE 4  KLING STR (GAUZE/BANDAGES/DRESSINGS) IMPLANT
BLADE CLIPPER SURG (BLADE) ×2 IMPLANT
BLADE SURG 11 STRL SS (BLADE) ×2 IMPLANT
BNDG COHESIVE 4X5 TAN NS LF (GAUZE/BANDAGES/DRESSINGS) IMPLANT
BUR ACORN 9.0 PRECISION (BURR) ×2 IMPLANT
CANISTER SUCT 3000ML PPV (MISCELLANEOUS) ×2 IMPLANT
CARTRIDGE OIL MAESTRO DRILL (MISCELLANEOUS) ×1 IMPLANT
CATH ROBINSON RED A/P 12FR (CATHETERS) ×2 IMPLANT
CLIP VESOCCLUDE MED 6/CT (CLIP) IMPLANT
CORD BIPOLAR FORCEPS 12FT (ELECTRODE) ×2 IMPLANT
DECANTER SPIKE VIAL GLASS SM (MISCELLANEOUS) ×2 IMPLANT
DERMABOND ADVANCED (GAUZE/BANDAGES/DRESSINGS) ×1
DERMABOND ADVANCED .7 DNX12 (GAUZE/BANDAGES/DRESSINGS) ×1 IMPLANT
DIFFUSER DRILL AIR PNEUMATIC (MISCELLANEOUS) ×2 IMPLANT
DRAPE NEUROLOGICAL W/INCISE (DRAPES) ×2 IMPLANT
DRAPE SURG 17X23 STRL (DRAPES) IMPLANT
DRAPE WARM FLUID 44X44 (DRAPE) ×2 IMPLANT
DRSG OPSITE POSTOP 3X4 (GAUZE/BANDAGES/DRESSINGS) ×4 IMPLANT
ELECT CAUTERY BLADE 6.4 (BLADE) ×2 IMPLANT
ELECT REM PT RETURN 9FT ADLT (ELECTROSURGICAL) ×2
ELECTRODE REM PT RTRN 9FT ADLT (ELECTROSURGICAL) ×1 IMPLANT
GAUZE SPONGE 4X4 12PLY STRL (GAUZE/BANDAGES/DRESSINGS) IMPLANT
GAUZE SPONGE 4X4 16PLY XRAY LF (GAUZE/BANDAGES/DRESSINGS) IMPLANT
GLOVE BIO SURGEON STRL SZ 6.5 (GLOVE) IMPLANT
GLOVE BIO SURGEON STRL SZ7 (GLOVE) IMPLANT
GLOVE BIO SURGEON STRL SZ7.5 (GLOVE) IMPLANT
GLOVE BIO SURGEON STRL SZ8 (GLOVE) ×2 IMPLANT
GLOVE BIO SURGEON STRL SZ8.5 (GLOVE) IMPLANT
GLOVE BIOGEL M 8.0 STRL (GLOVE) ×2 IMPLANT
GLOVE BIOGEL PI IND STRL 7.0 (GLOVE) IMPLANT
GLOVE BIOGEL PI INDICATOR 7.0 (GLOVE)
GLOVE ECLIPSE 6.5 STRL STRAW (GLOVE) IMPLANT
GLOVE ECLIPSE 7.0 STRL STRAW (GLOVE) IMPLANT
GLOVE ECLIPSE 7.5 STRL STRAW (GLOVE) IMPLANT
GLOVE ECLIPSE 8.0 STRL XLNG CF (GLOVE) IMPLANT
GLOVE ECLIPSE 8.5 STRL (GLOVE) IMPLANT
GLOVE EXAM NITRILE LRG STRL (GLOVE) IMPLANT
GLOVE EXAM NITRILE XL STR (GLOVE) IMPLANT
GLOVE EXAM NITRILE XS STR PU (GLOVE) IMPLANT
GLOVE INDICATOR 6.5 STRL GRN (GLOVE) IMPLANT
GLOVE INDICATOR 7.0 STRL GRN (GLOVE) IMPLANT
GLOVE INDICATOR 7.5 STRL GRN (GLOVE) IMPLANT
GLOVE INDICATOR 8.0 STRL GRN (GLOVE) ×2 IMPLANT
GLOVE INDICATOR 8.5 STRL (GLOVE) ×2 IMPLANT
GLOVE OPTIFIT SS 8.0 STRL (GLOVE) IMPLANT
GLOVE SURG SS PI 6.5 STRL IVOR (GLOVE) IMPLANT
GOWN STRL REUS W/ TWL LRG LVL3 (GOWN DISPOSABLE) ×1 IMPLANT
GOWN STRL REUS W/ TWL XL LVL3 (GOWN DISPOSABLE) ×1 IMPLANT
GOWN STRL REUS W/TWL 2XL LVL3 (GOWN DISPOSABLE) ×2 IMPLANT
GOWN STRL REUS W/TWL LRG LVL3 (GOWN DISPOSABLE) ×1
GOWN STRL REUS W/TWL XL LVL3 (GOWN DISPOSABLE) ×1
HEMOSTAT SURGICEL 2X14 (HEMOSTASIS) IMPLANT
HOOK DURA (MISCELLANEOUS) IMPLANT
KIT BASIN OR (CUSTOM PROCEDURE TRAY) ×2 IMPLANT
KIT ROOM TURNOVER OR (KITS) ×2 IMPLANT
NEEDLE HYPO 25X1 1.5 SAFETY (NEEDLE) ×2 IMPLANT
NS IRRIG 1000ML POUR BTL (IV SOLUTION) ×2 IMPLANT
OIL CARTRIDGE MAESTRO DRILL (MISCELLANEOUS) ×2
PACK CRANIOTOMY (CUSTOM PROCEDURE TRAY) ×2 IMPLANT
PAD ARMBOARD 7.5X6 YLW CONV (MISCELLANEOUS) ×6 IMPLANT
PATTIES SURGICAL .25X.25 (GAUZE/BANDAGES/DRESSINGS) IMPLANT
PATTIES SURGICAL .5 X.5 (GAUZE/BANDAGES/DRESSINGS) IMPLANT
PATTIES SURGICAL .5 X3 (DISPOSABLE) IMPLANT
PATTIES SURGICAL 1X1 (DISPOSABLE) IMPLANT
PIN MAYFIELD SKULL DISP (PIN) IMPLANT
SPONGE NEURO XRAY DETECT 1X3 (DISPOSABLE) IMPLANT
SPONGE SURGIFOAM ABS GEL 100 (HEMOSTASIS) IMPLANT
STAPLER VISISTAT 35W (STAPLE) ×2 IMPLANT
SUT NURALON 4 0 TR CR/8 (SUTURE) ×4 IMPLANT
SUT VIC AB 2-0 CT1 18 (SUTURE) ×2 IMPLANT
SYR CONTROL 10ML LL (SYRINGE) ×2 IMPLANT
TOWEL GREEN STERILE (TOWEL DISPOSABLE) ×2 IMPLANT
TOWEL GREEN STERILE FF (TOWEL DISPOSABLE) ×2 IMPLANT
TRAY FOLEY W/METER SILVER 16FR (SET/KITS/TRAYS/PACK) IMPLANT
WATER STERILE IRR 1000ML POUR (IV SOLUTION) ×2 IMPLANT

## 2017-05-24 NOTE — Anesthesia Postprocedure Evaluation (Signed)
Anesthesia Post Note  Patient: Gabriel Fox  Procedure(s) Performed: Procedure(s) (LRB): BURR HOLE Craniectomy (Left)     Patient location during evaluation: PACU Anesthesia Type: General Level of consciousness: awake and alert Pain management: pain level controlled Vital Signs Assessment: post-procedure vital signs reviewed and stable Respiratory status: spontaneous breathing, nonlabored ventilation, respiratory function stable and patient connected to nasal cannula oxygen Cardiovascular status: blood pressure returned to baseline and stable Postop Assessment: no signs of nausea or vomiting Anesthetic complications: no    Last Vitals:  Vitals:   05/24/17 2030 05/24/17 2045  BP:    Pulse: 89 86  Resp: 17 18  Temp:      Last Pain:  Vitals:   05/24/17 1643  TempSrc: Oral  PainSc:                  OSSEY,KEVIN DAVID

## 2017-05-24 NOTE — Progress Notes (Signed)
Patient ID: Gabriel Fox, male   DOB: 08/27/1986, 31 y.o.   MRN: [id number] Patient doing well this morning. Results of MRI scan are consistent with hematoma not consistent with empyema. Patient does have a more remote history of a head trauma at work 1-2 weeks ago feel this is most likely the source of the subacute nature of the subdural. I've gone over the risks and benefits of left-sided bur hole evacuation of a subdural hematoma extensive plantar explained perioperative course expectations of outcome alternatives of surgery and he understands and agrees to proceed forward. Neurologic exam remains intact

## 2017-05-24 NOTE — Addendum Note (Signed)
Addendum  created 05/24/17 2057 by Tillman AbideHawkins, Joshua B, CRNA   Anesthesia Intra Meds edited

## 2017-05-24 NOTE — Op Note (Signed)
Pre-operative diagnosis: Left parietal subacute subdural hematoma  Postoperative diagnosis: Same  Procedure: Left burr hole craniectomy for evacuation of a left subacute subdural hematoma  Surgeon: Jillyn HiddenGary cram  Anesthesia: Gen.  EBL: Minimal  Specimen: Cultures sent the fluid  HPI: 31 year old gentleman presented emergent last night with report of a head injury on Monday and also one may be more remote workup with CT scan showed a subacute fluid collection patient also had fever and was felt this could possibly represent empyema patient underwent MRI scan which did not show any pathologic imaging characteristics consistent with empyema looked more like subacute subdural hematoma. Patient also tested positive for strep. However the fluid collection was causing mass effect local on that part of the hemisphere and in this setting with mass effect very unusual subacute subdural hematoma the patient his age I recommended burr hole evacuation. I extensively went over the risks benefits perioperative course expectations of outcome of her surgery patient understood and agreed to proceed forward.  Operative procedure: Was discharged on general anesthesia positioned supine the neck turned the right side with a shoulder bump under his left shoulder exposing the left side of his frontoparietal skull. I shaved a tractor superiorly the superior temporal line and dry 2 incisions one frontally 1 parietally after infiltration of 5 mL lidocaine with epi these incisions were made 2 bur holes were drilled the dura was coagulated and incised in a cruciate fashion. As I opened up the parietal bur hole and did take cultures of the fluid of fluid was very dark but it was also very cloudy and did not appear like sterile old blood. I opened up the frontal burr hole and evacuated similar appearing fluid also identified a membrane on the cortical surface that I opened up to create medication of the fluid. Copiously irrigated  between bur holes until the irrigant was clear. Tended to pass a red rubber catheter however this did not pass easily so I did not force it. I then closed the parietal burr hole with interrupted Vicryl and staples irrigated to the frontal burr hole close this in the same fashion. Wounds were dressed patient recovered in stable condition. At the end the case all needle and needle counts sponge counts were correct.

## 2017-05-24 NOTE — ED Provider Notes (Signed)
[facility] DEPT Provider Note   CSN: [id number] Arrival date & time: 05/23/17  1849     History   Chief Complaint Chief Complaint  Patient presents with  . Headache    HPI Gabriel Fox is a 31 y.o. male.  HPI Patient presents to the emergency department with headache since yesterday.  The patient states she has also had sore throat, fever and cough.  Patient states that the symptoms started this morning.  Patient states that nothing seems make the condition better or worse.  She states he took some DayQuil without significant relief of his symptoms. The patient denies chest pain, shortness of breath, blurred vision, neck pain, weakness, numbness, dizziness, anorexia, edema, abdominal pain, nausea, vomiting, diarrhea, rash, back pain, dysuria, hematemesis, bloody stool, near syncope, or syncope.  Patient did mention that he fell and hit his head at [facility] park on Sunday.  History reviewed. No pertinent past medical history.  There are no active problems to display for this patient.   History reviewed. No pertinent surgical history.     Home Medications    Prior to Admission medications   Medication Sig Start Date End Date Taking? Authorizing Provider  ondansetron (ZOFRAN) 4 MG tablet Take 1 tablet (4 mg total) by mouth every 6 (six) hours. 07/28/12   Marlon Pel, PA-C  oxyCODONE-acetaminophen (PERCOCET/ROXICET) 5-325 MG per tablet Take 1 tablet by mouth every 6 (six) hours as needed for pain. 07/28/12   Marlon Pel, PA-C    Family History History reviewed. No pertinent family history.  Social History Social History  Substance Use Topics  . Smoking status: Current Every Day Smoker  . Smokeless tobacco: Never Used  . Alcohol use Yes     Comment: denies     Allergies   Patient has no known allergies.   Review of Systems Review of Systems All other systems negative except as documented in the HPI. All pertinent positives and negatives as  reviewed in the HPI.  Physical Exam Updated Vital Signs BP (!) 102/53   Pulse 90   Temp 99.8 F (37.7 C) (Oral)   Resp 18   SpO2 100%   Physical Exam  Constitutional: He is oriented to person, place, and time. He appears well-developed and well-nourished. No distress.  HENT:  Head: Normocephalic and atraumatic.  Mouth/Throat: Oropharynx is clear and moist.  Eyes: Pupils are equal, round, and reactive to light.  Neck: Normal range of motion. Neck supple.  Cardiovascular: Normal rate, regular rhythm and normal heart sounds.  Exam reveals no gallop and no friction rub.   No murmur heard. Pulmonary/Chest: Effort normal and breath sounds normal. No respiratory distress. He has no wheezes.  Abdominal: Soft. Bowel sounds are normal. He exhibits no distension. There is no tenderness.  Neurological: He is alert and oriented to person, place, and time. He has normal strength. No sensory deficit. He exhibits normal muscle tone. Coordination and gait normal. GCS eye subscore is 4. GCS verbal subscore is 5. GCS motor subscore is 6.  Skin: Skin is warm and dry. Capillary refill takes less than 2 seconds. No rash noted. No erythema.  Psychiatric: He has a normal mood and affect. His behavior is normal.  Nursing note and vitals reviewed.    ED Treatments / Results  Labs (all labs ordered are listed, but only abnormal results are displayed) Labs Reviewed  RAPID STREP SCREEN (NOT AT [facility]) - Abnormal; Notable for the following:       Result  Value   Streptococcus, Group A Screen (Direct) POSITIVE (*)    All other components within normal limits  COMPREHENSIVE METABOLIC PANEL - Abnormal; Notable for the following:    Sodium 133 (*)    CO2 21 (*)    Glucose, Bld 101 (*)    All other components within normal limits  CBC - Abnormal; Notable for the following:    WBC 11.8 (*)    MCV 76.0 (*)    MCH 24.4 (*)    All other components within normal limits  URINALYSIS, ROUTINE W REFLEX MICROSCOPIC      EKG  EKG Interpretation None       Radiology No results found.  Procedures Procedures (including critical care time)  Medications Ordered in ED Medications  sodium chloride 0.9 % bolus 1,000 mL (1,000 mLs Intravenous New Bag/Given 05/24/17 0007)     Initial Impression / Assessment and Plan / ED Course  I have reviewed the triage vital signs and the nursing notes.  Pertinent labs & imaging results that were available during my care of the patient were reviewed by me and considered in my medical decision making (see chart for details).     I received a call from the radiologist about the patient's head CT showed an abnormality in the left hemisphere.  He requested to get MRI to further delineate the cause of this abnormality.  His thoughts were that it blood versus infection.  With the recent trauma.  Does seem more likely to be blood.   Final Clinical Impressions(s) / ED Diagnoses   Final diagnoses:  None    New Prescriptions New Prescriptions   No medications on file     Charlestine NightLawyer, Christopher, Cordelia Poche-C 05/24/17 0046    Benjiman CorePickering, Nathan, MD 05/24/17 1524

## 2017-05-24 NOTE — Progress Notes (Signed)
Pt admitted to [facility] from ER at this time.  Pt denies pain/headache.  States received "a shot" in ER.  Family at bedside.  Pt has been NPO since 9 a.m per ER RN.  Called OR and they will obtain consent.  Will submit PCR screen.

## 2017-05-24 NOTE — ED Provider Notes (Signed)
  [facility].  Please see his complete H&P.  Gabriel Fox is a 31 y.o. male presents with strep throat, fever and tachycardia. Patient also complaining of headache, but pt reports ground level fall on Sunday.  CT with abnormality concerning for hemorrhage versus empyema.  Pt with normal neuro exam on initial provider's exam.  Pt will need MRI.   Physical Exam  BP 121/87   Pulse (!) 102   Temp 99.8 F (37.7 C) (Oral)   Resp 18   SpO2 100%   Physical Exam  Constitutional: He appears well-developed and well-nourished. No distress.  HENT:  Head: Normocephalic.  Eyes: Conjunctivae are normal. No scleral icterus.  Neck: Normal range of motion.  Cardiovascular: Normal rate and intact distal pulses.   Pulmonary/Chest: Effort normal.  Musculoskeletal: Normal range of motion.  Neurological: He is alert.  Skin: Skin is warm and dry.  Nursing note and vitals reviewed.   ED Course  Procedures  Clinical Course as of May 24 525  Thu May 24, 2017  0110 Plan: MRI pending.  Neurosurgery PA aware of patient.    [HM]  0500 MRI with subacute subdural hematoma  [HM]  0514 Discussed with Trinidad CuretKim, PA-C of neurosurgery who will admit the patient  [HM]  0525 Pt remains neurologically intact  [HM]    Clinical Course User Index [HM] Muthersbaugh, Boyd KerbsHannah, PA-C     MDM  Patient with subdural hematoma on MRI. Patient will need neurosurgery consult and admission. They're not advising emergent surgery tonight.   Subdural hematoma (HCC)  Headache - Plan: MR BRAIN W WO CONTRAST, MR BRAIN W WO CONTRAST  Strep throat    Muthersbaugh, Boyd KerbsHannah, PA-C 05/24/17 [id number]    Benjiman CorePickering, Nathan, MD 05/24/17 [id number]

## 2017-05-24 NOTE — Anesthesia Procedure Notes (Signed)
Procedure Name: Intubation Date/Time: 05/24/2017 7:19 PM Performed by: Oletta Lamas Pre-anesthesia Checklist: Patient identified, Emergency Drugs available, Suction available and Patient being monitored Patient Re-evaluated:Patient Re-evaluated prior to induction Oxygen Delivery Method: Circle System Utilized Preoxygenation: Pre-oxygenation with 100% oxygen Induction Type: IV induction Ventilation: Mask ventilation without difficulty Laryngoscope Size: Mac and 4 Grade View: Grade II Tube type: Oral Tube size: 7.5 mm Number of attempts: 1 Airway Equipment and Method: Stylet and Oral airway Placement Confirmation: ETT inserted through vocal cords under direct vision,  positive ETCO2 and breath sounds checked- equal and bilateral Secured at: 23 cm Tube secured with: Tape Dental Injury: Teeth and Oropharynx as per pre-operative assessment

## 2017-05-24 NOTE — ED Notes (Signed)
Patient reminded of NPO status.

## 2017-05-24 NOTE — ED Notes (Signed)
Patient transported to MRI 

## 2017-05-24 NOTE — Progress Notes (Signed)
Pt to OR at this time; chg bath completed. Report called.

## 2017-05-24 NOTE — H&P (Signed)
Subjective:   Patient is a 31 y.o. male seen regarding recent fall on Sunday. The patient first presented with complaints of headache and fever. Onset of symptoms was 5 days ago, gradually worsening since that time. Onset was related to a fall. The pain is described as aching and dull and occurs all day. The pain is rated moderate, and is located in the head. The symptoms have been progressive. Symptoms are exacerbated by none, and are relieved by none. History positive for trauma, details: Patient states that he slipped and fell at [place] on Sunday. Since then his headache has been progressively getting worse with N/V as well. He states that he did hit his head on a metal rack at work approximately a week and a half ago. Previous work up includes Ct and MRI of the head, results: 2mm thickness left convexity subdural.  History reviewed. No pertinent past medical history.  History reviewed. No pertinent surgical history.  No Known Allergies  Social History  Substance Use Topics  . Smoking status: Current Every Day Smoker  . Smokeless tobacco: Never Used  . Alcohol use Yes     Comment: denies    History reviewed. No pertinent family history. Prior to Admission medications   Not on File     Review of Systems  Positive ROS:   All other systems have been reviewed and were otherwise negative with the exception of those mentioned in the HPI and as above.  Objective: Vital signs in last 24 hours: Temp:  [99.8 F (37.7 C)] 99.8 F (37.7 C) (07/18 1854) Pulse Rate:  [39-102] 87 (07/19 0645) Resp:  [14-18] 16 (07/19 0215) BP: (81-124)/(33-87) 109/83 (07/19 0645) SpO2:  [90 %-100 %] 98 % (07/19 0645)  General Appearance: Alert, cooperative, no distress, appears stated age Head: Normocephalic, without obvious abnormality, atraumatic Eyes: PERRL, conjunctiva/corneas clear, EOM's intact, both eyes      Back: Not tested Lungs:respirations unlabored Heart: Regular rate and  rhythm Extremities: Extremities normal, atraumatic, no cyanosis or edema Pulses: not tested Skin: Skin color, texture, turgor normal  NEUROLOGIC:   Mental status: A&O x4, no aphasia, good AS, fund of knowledge and memory Motor Exam - grossly normal Sensory Exam - grossly normal Reflexes: Coordination - grossly normal Gait - not tested Balance - grossly normal Cranial Nerves: I: smell Not tested  II: visual acuity  OS: NA  OD: NA  II: visual fields Full to confrontation  II: pupils Equal, round, reactive to light  III,VII: ptosis None  III,IV,VI: extraocular muscles  Full ROM  V: mastication Normal  V: facial light touch sensation  Normal  V,VII: corneal reflex  Present  VII: facial muscle function - upper  Normal  VII: facial muscle function - lower Normal  VIII: hearing Not tested  IX: soft palate elevation  Normal  IX,X: gag reflex Present  XI: trapezius strength  5/5  XI: sternocleidomastoid strength 5/5  XI: neck flexion strength  5/5  XII: tongue strength  Normal    Data Review Lab Results  Component Value Date   WBC 11.8 (H) 05/23/2017   HGB 13.0 05/23/2017   HCT 40.5 05/23/2017   MCV 76.0 (L) 05/23/2017   PLT 181 05/23/2017   Lab Results  Component Value Date   NA 133 (L) 05/23/2017   K 3.9 05/23/2017   CL 103 05/23/2017   CO2 21 (L) 05/23/2017   BUN 8 05/23/2017   CREATININE 1.17 05/23/2017   GLUCOSE 101 (H) 05/23/2017   No  results found for: INR, PROTIME  Assessment/Plan: Pleasant 31 year old here today for headache and fever. He states that his headache got worse on Tuesday after his fall at [facility] on Sunday. He was positive for strep. He is currently being treated for that. He is neuro intact and stable. This SDH does appear to be about 87 weeks old. We will keep him NPO after 9:00am and take him for burr holes this afternoon to evacuate the hematoma. Risks and benefits were discussed with the patient and he agrees to move forward with the  surgery. Will admit to the floor before surgery and ICU post op.    Tiana Loft Meyran 05/24/2017 7:00 AM

## 2017-05-24 NOTE — Transfer of Care (Signed)
Immediate Anesthesia Transfer of Care Note  Patient: Gabriel DouglasDonald G Hult  Procedure(s) Performed: Procedure(s): BURR HOLE Craniectomy (Left)  Patient Location: PACU  Anesthesia Type:General  Level of Consciousness: drowsy, patient cooperative and responds to stimulation  Airway & Oxygen Therapy: Patient Spontanous Breathing and Patient connected to face mask oxygen  Post-op Assessment: Report given to RN and Post -op Vital signs reviewed and stable  Post vital signs: Reviewed and stable  Last Vitals:  Vitals:   05/24/17 1600 05/24/17 1643  BP: 132/75 138/80  Pulse: 85 95  Resp: 16 18  Temp: 37.9 C (!) 38.4 C    Last Pain:  Vitals:   05/24/17 1643  TempSrc: Oral  PainSc:          Complications: No apparent anesthesia complications

## 2017-05-24 NOTE — Anesthesia Preprocedure Evaluation (Signed)
Anesthesia Evaluation  Patient identified by MRN, date of birth, ID band Patient awake    Reviewed: Allergy & Precautions, NPO status , Patient's Chart, lab work & pertinent test results  Airway Mallampati: I  TM Distance: >3 FB Neck ROM: Full    Dental   Pulmonary Current Smoker,    Pulmonary exam normal        Cardiovascular Normal cardiovascular exam     Neuro/Psych    GI/Hepatic   Endo/Other    Renal/GU      Musculoskeletal   Abdominal   Peds  Hematology   Anesthesia Other Findings   Reproductive/Obstetrics                             Anesthesia Physical Anesthesia Plan  ASA: II and emergent  Anesthesia Plan: General   Post-op Pain Management:    Induction: Intravenous  PONV Risk Score and Plan: 1 and Ondansetron and Dexamethasone  Airway Management Planned: Oral ETT  Additional Equipment:   Intra-op Plan:   Post-operative Plan: Extubation in OR  Informed Consent: I have reviewed the patients History and Physical, chart, labs and discussed the procedure including the risks, benefits and alternatives for the proposed anesthesia with the patient or authorized representative who has indicated his/her understanding and acceptance.     Plan Discussed with: CRNA and Surgeon  Anesthesia Plan Comments:         Anesthesia Quick Evaluation

## 2017-05-25 ENCOUNTER — Inpatient Hospital Stay (HOSPITAL_COMMUNITY): Payer: Self-pay

## 2017-05-25 ENCOUNTER — Encounter (HOSPITAL_COMMUNITY): Payer: Self-pay | Admitting: Neurosurgery

## 2017-05-25 DIAGNOSIS — R509 Fever, unspecified: Secondary | ICD-10-CM

## 2017-05-25 DIAGNOSIS — F17210 Nicotine dependence, cigarettes, uncomplicated: Secondary | ICD-10-CM

## 2017-05-25 DIAGNOSIS — J020 Streptococcal pharyngitis: Secondary | ICD-10-CM

## 2017-05-25 DIAGNOSIS — I6200 Nontraumatic subdural hemorrhage, unspecified: Secondary | ICD-10-CM

## 2017-05-25 LAB — BASIC METABOLIC PANEL
Anion gap: 10 (ref 5–15)
BUN: 8 mg/dL (ref 6–20)
CO2: 24 mmol/L (ref 22–32)
Calcium: 9.2 mg/dL (ref 8.9–10.3)
Chloride: 105 mmol/L (ref 101–111)
Creatinine, Ser: 1.13 mg/dL (ref 0.61–1.24)
GFR calc Af Amer: >60 mL/min (ref >60)
GFR calc non Af Amer: >60 mL/min (ref >60)
Glucose, Bld: 135 mg/dL — ABNORMAL HIGH (ref 65–99)
Potassium: 4.3 mmol/L (ref 3.5–5.1)
Sodium: 139 mmol/L (ref 135–145)

## 2017-05-25 LAB — SURGICAL PCR SCREEN
MRSA, PCR: NEGATIVE
Staphylococcus aureus: NEGATIVE

## 2017-05-25 MED ORDER — PANTOPRAZOLE SODIUM 40 MG PO TBEC
40.0000 mg | DELAYED_RELEASE_TABLET | Freq: Every day | ORAL | Status: DC
  Administered 2017-05-25 – 2017-05-27 (×3): 40 mg via ORAL
  Filled 2017-05-25 (×3): qty 1

## 2017-05-25 NOTE — Progress Notes (Signed)
Patient transferred to the [facility] at this time.

## 2017-05-25 NOTE — Progress Notes (Signed)
Called MD's office to get a diet order for patient. Awaiting a call back/orders.

## 2017-05-25 NOTE — Consult Note (Signed)
[facility]       Reason for Consult: possible infection in hematoma    Referring Physician: Dr. Wynetta Emery  Active Problems:   Subdural hematoma (HCC)   SDH (subdural hematoma) (HCC)   . docusate sodium  100 mg Oral BID  . pantoprazole  40 mg Oral QHS  . senna-docusate  1 tablet Oral BID    Recommendations: Continue to observe off of antibiotics Will watch culture He has had adequate treatment for GAS pharyngitis with once dose IM penicillin  Routine HIV testing per CDC guidelines  Assessment: He has a hematoma and fever and concern for empyema and underwent burr hoe and aspiration and found to be more like hematoma.  Cultures sent and ngtd. Strep throat - already received adequate treatment  Antibiotics: Penicillin 1.2 million units x 1 dose.  HPI: Gabriel Fox is a 31 y.o. male with head injury on Monday and came in on 7/18 and CT, then MRI concerning for hematoma.  Also had strep throat.  Had some mass effect and not typical of his age to develop a subdural hematoma and so underwent bur hole and was irrigated.  No fever, no chills.  He was having a sore throat that has now resolved.  He has no complaints now.  No drainage from the area.    Review of Systems:  Constitutional: negative for fevers, chills, fatigue and malaise Gastrointestinal: negative for nausea and diarrhea Integument/breast: negative for rash All other systems reviewed and are negative    PMH: no prior medical problems  Social History  Substance Use Topics  . Smoking status: Current Every Day Smoker  . Smokeless tobacco: Never Used  . Alcohol use Yes     Comment: denies    FMH: no cardiac disease  No Known Allergies  Physical Exam: Constitutional: in no apparent distress and alert  Vitals:   05/25/17 0858 05/25/17 1232  BP:  119/65  Pulse:  79  Resp:  16  Temp: 97.8 F (36.6 C) 97.7 F (36.5 C)   EYES: anicteric ENMT:no thrush Cardiovascular: Cor RRR  and No murmurs Respiratory: CT AB; normal respiratory effort GI: Bowel sounds are normal, liver is not enlarged, spleen is not enlarged, soft, nt Musculoskeletal: no pedal edema noted Skin: no rash HENT: left scalp covered, no drainage  Lab Results  Component Value Date   WBC 13.2 (H) 05/24/2017   HGB 13.1 05/24/2017   HCT 41.2 05/24/2017   MCV 76.4 (L) 05/24/2017   PLT 178 05/24/2017    Lab Results  Component Value Date   CREATININE 1.13 05/25/2017   BUN 8 05/25/2017   NA 139 05/25/2017   K 4.3 05/25/2017   CL 105 05/25/2017   CO2 24 05/25/2017    Lab Results  Component Value Date   ALT 24 05/23/2017   AST 22 05/23/2017   ALKPHOS 50 05/23/2017     Microbiology: Recent Results (from the past 240 hour(s))  Rapid strep screen     Status: Abnormal   Collection Time: 05/24/17 12:09 AM  Result Value Ref Range Status   Streptococcus, Group A Screen (Direct) POSITIVE (A) NEGATIVE Final  Aerobic/Anaerobic Culture (surgical/deep wound)     Status: None (Preliminary result)   Collection Time: 05/24/17  8:05 PM  Result Value Ref Range Status   Specimen Description WOUND  Final   Special Requests LEFT PARIETAL FLUID FLUID ON SWAB  Final   Gram Stain   Final  MODERATE WBC PRESENT,BOTH PMN AND MONONUCLEAR NO ORGANISMS SEEN    Culture NO GROWTH < 24 HOURS  Final   Report Status PENDING  Incomplete  Surgical pcr screen     Status: None   Collection Time: 05/24/17 11:59 PM  Result Value Ref Range Status   MRSA, PCR NEGATIVE NEGATIVE Final   Staphylococcus aureus NEGATIVE NEGATIVE Final    Comment:        The Xpert SA Assay (FDA approved for NASAL specimens in patients over 31 years of age), is one component of a comprehensive surveillance program.  Test performance has been validated by [facility] for patients greater than or equal to 31 year old. It is not intended to diagnose infection nor to guide or monitor treatment.     Staci RighterOMER, Harlee Pursifull, MD [facility] [email or web address] [phone] pager  [phone] cell 05/25/2017, 4:10 PM

## 2017-05-25 NOTE — Progress Notes (Signed)
Patient is being transferred to the unit. Report called to the receiving nurse.

## 2017-05-25 NOTE — Progress Notes (Signed)
Subjective: Patient reports a mild headache but well controlled with medication. He is eating and voiding well. Denies any N/V or vision changes.   Objective: Vital signs in last 24 hours: Temp:  [97.6 F (36.4 C)-101.1 F (38.4 C)] 97.8 F (36.6 C) (07/20 0858) Pulse Rate:  [71-95] 77 (07/20 0800) Resp:  [13-26] 15 (07/20 0800) BP: (97-140)/(51-86) 98/62 (07/20 0800) SpO2:  [79 %-100 %] 100 % (07/20 0800) Weight:  [93 kg (205 lb)] 93 kg (205 lb) (07/19 2204)  Intake/Output from previous day: 07/19 0701 - 07/20 0700 In: 1959.2 [I.V.:1859.2; IV Piggyback:100] Out: 40 [Blood:40] Intake/Output this shift: Total I/O In: 75 [I.V.:75] Out: -   Neurologic: Grossly normal  Lab Results: Lab Results  Component Value Date   WBC 13.2 (H) 05/24/2017   HGB 13.1 05/24/2017   HCT 41.2 05/24/2017   MCV 76.4 (L) 05/24/2017   PLT 178 05/24/2017   No results found for: INR, PROTIME BMET Lab Results  Component Value Date   NA 139 05/25/2017   K 4.3 05/25/2017   CL 105 05/25/2017   CO2 24 05/25/2017   GLUCOSE 135 (H) 05/25/2017   BUN 8 05/25/2017   CREATININE 1.13 05/25/2017   CALCIUM 9.2 05/25/2017    Studies/Results: Ct Head Wo Contrast  Result Date: 05/25/2017 CLINICAL DATA:  Status post evacuation of extra-axial fluid collection. EXAM: CT HEAD WITHOUT CONTRAST TECHNIQUE: Contiguous axial images were obtained from the base of the skull through the vertex without intravenous contrast. COMPARISON:  Brain MRI and head CT 05/24/2017 FINDINGS: Brain: There has been evacuation of the previously seen of left convexity extra-axial collection. There is a small amount of postoperative pneumocephalus and extra-axial fluid at this site. Mass effect has resolved. There is no acute intraparenchymal hemorrhage. No hydrocephalus. Vascular: No hyperdense vessel or unexpected calcification. Skull: 2 left parietal burr holes. Sinuses/Orbits: No sinus fluid levels or advanced mucosal thickening. No  mastoid effusion. Normal orbits. IMPRESSION: Expected postoperative findings following burr hole drainage of left convexity extra-axial collection without acute abnormality. Electronically Signed   By: Deatra RobinsonKevin  Herman M.D.   On: 05/25/2017 04:31   Ct Head Wo Contrast  Result Date: 05/24/2017 CLINICAL DATA:  Headache and fever[place]. EXAM: CT HEAD WITHOUT CONTRAST TECHNIQUE: Contiguous axial images were obtained from the base of the skull through the vertex without intravenous contrast. COMPARISON:  None. FINDINGS: Brain: There is a low density left convexity subdural collection that measures up to 18 mm in thickness. There is mass effect on the left hemisphere but no midline shift. No hydrocephalus. Brain parenchyma is normal. Vascular: No hyperdense vessel or unexpected calcification. Skull: Normal visualized skull base, calvarium and extracranial soft tissues. Sinuses/Orbits: No sinus fluid levels or advanced mucosal thickening. No mastoid effusion. Normal orbits. IMPRESSION: 1. Low-density left hemispheric epidural collection. In the reported context of fever and headache, the findings could indicate the presence of a subdural empyema. With the additional history of recent head injury, a subacute hematoma is also a possibility, though the density is lower than expected given the reportedly recent trauma. MRI of the brain with and without contrast is recommended. 2. Mass effect on the left hemisphere without midline shift or hydrocephalus. Critical Value/emergent results were called by telephone at the time of interpretation on 05/24/2017 at 12:38 am to [facility] LAWYER , who verbally acknowledged these results. Electronically Signed   By: Deatra RobinsonKevin  Herman M.D.   On: 05/24/2017 00:41   Mr Brain  W Wo Contrast  Result Date: 05/24/2017 CLINICAL DATA:  Headache with recent sore throat, fever and cough. The patient fell and hit his head on 05/20/2017. EXAM: MRI HEAD WITHOUT  AND WITH CONTRAST TECHNIQUE: Multiplanar, multiecho pulse sequences of the brain and surrounding structures were obtained without and with intravenous contrast. CONTRAST:  15mL MULTIHANCE GADOBENATE DIMEGLUMINE 529 MG/ML IV SOLN COMPARISON:  Head CT 05/23/2017 FINDINGS: Brain: There is a large collection overlying the left convexity, measuring up to approximately 2 cm in thickness. The collection shows homogeneous signal, T1 hyperintense and T2 low to intermediate intensity. No hyperintensity on diffusion-weighted imaging. There is no focal diffusion restriction to indicate acute infarct. The brain parenchymal signal is normal and there is no mass lesion. There is no peripheral contrast enhancement. There is mass effect on the underlying left frontal and parietal lobes but there is no parenchymal signal change. There is mild mass effect on left lateral ventricle but no midline shift. Vascular: Major intracranial arterial and venous sinus flow voids are preserved. Skull and upper cervical spine: The visualized skull base, calvarium, upper cervical spine and extracranial soft tissues are normal. Sinuses/Orbits: No fluid levels or advanced mucosal thickening. No mastoid effusion. Normal orbits. IMPRESSION: 1. 2 cm thickness left convexity subdural collection with high T1- and intermediate-to-low T2-weighted signal without diffusion restriction or peripheral contrast enhancement is most consistent with subacute subdural hematoma, particularly in the context of recent trauma. 2. There is mass effect on the underlying brain parenchyma but no parenchymal signal change. No midline shift or hydrocephalus. Electronically Signed   By: Deatra Robinson M.D.   On: 05/24/2017 04:28    Assessment/Plan: Patient is doing well. Mild headache controlled with pain medication. Tolerating diet.  Transfer to the floor today and start ambulating with assistance. D/C home this weekend if he continues doing well.    LOS: 1 day     Tiana Loft Meyran 05/25/2017, 10:52 AM

## 2017-05-26 LAB — CBC WITH DIFFERENTIAL/PLATELET
Basophils Absolute: 0 10*3/uL (ref 0.0–0.1)
Basophils Relative: 0 %
Eosinophils Absolute: 0 10*3/uL (ref 0.0–0.7)
Eosinophils Relative: 0 %
HCT: 40.6 % (ref 39.0–52.0)
Hemoglobin: 13.2 g/dL (ref 13.0–17.0)
Lymphocytes Relative: 15 %
Lymphs Abs: 2.1 10*3/uL (ref 0.7–4.0)
MCH: 25 pg — ABNORMAL LOW (ref 26.0–34.0)
MCHC: 32.5 g/dL (ref 30.0–36.0)
MCV: 76.9 fL — ABNORMAL LOW (ref 78.0–100.0)
Monocytes Absolute: 1.3 10*3/uL — ABNORMAL HIGH (ref 0.1–1.0)
Monocytes Relative: 10 %
Neutro Abs: 10.3 10*3/uL — ABNORMAL HIGH (ref 1.7–7.7)
Neutrophils Relative %: 75 %
Platelets: 202 10*3/uL (ref 150–400)
RBC: 5.28 MIL/uL (ref 4.22–5.81)
RDW: 14.1 % (ref 11.5–15.5)
WBC: 13.8 10*3/uL — ABNORMAL HIGH (ref 4.0–10.5)

## 2017-05-26 NOTE — Progress Notes (Signed)
No growth to date on culture.  Will follow up again on Monday.  No antibiotics indicated.

## 2017-05-26 NOTE — Progress Notes (Signed)
Pt seen and examined.  No issues overnight. Minimal headache  EXAM: Temp:  [97.7 F (36.5 C)-98.2 F (36.8 C)] 98.1 F (36.7 C) (07/21 0531) Pulse Rate:  [79-85] 80 (07/21 0531) Resp:  [16-20] 20 (07/21 0531) BP: (115-129)/(65-77) 122/72 (07/21 0531) SpO2:  [98 %-99 %] 98 % (07/21 0531) Intake/Output      07/20 0701 - 07/21 0700 07/21 0701 - 07/22 0700   P.O. 240    I.V. (mL/kg) 75 (0.8)    Total Intake(mL/kg) 315 (3.4)    Net +315          Urine Occurrence 2 x     Awake and alert Follows commands throughout CN grossly intact Full strength Incision c/d/i  Plan Doing well.  Cleared from NS to go home Wait for clearance from ID Likely d/c tomorrow

## 2017-05-27 NOTE — Progress Notes (Signed)
Pt seen and examined.  No issues overnight. No concerns this am  EXAM: Temp:  [98 F (36.7 C)-98.8 F (37.1 C)] 98.8 F (37.1 C) (07/22 0501) Pulse Rate:  [87-97] 94 (07/22 0501) Resp:  [16-20] 16 (07/22 0501) BP: (113-130)/(70-100) 126/75 (07/22 0501) SpO2:  [92 %-99 %] 98 % (07/22 0501) Intake/Output      07/21 0701 - 07/22 0700   P.O. 720   Total Intake(mL/kg) 720 (7.7)   Net +720       Urine Occurrence 2 x    Awake and alert Follows commands throughout Full strength CN grossly intact Incision c/d/i  Plan Stable. Continue current care Waiting for ID clearance to d/c home

## 2017-05-28 MED ORDER — HYDROCODONE-ACETAMINOPHEN 5-325 MG PO TABS: 1.0000 | ORAL_TABLET | ORAL | 0 refills | Status: DC | PRN

## 2017-05-28 NOTE — Progress Notes (Signed)
    [facility]   Reason for visit: Follow up on hematoma  Interval History: no growth on cultures  Physical Exam: Constitutional:  Vitals:   05/28/17 0549 05/28/17 0947  BP: 128/78 125/69  Pulse: 86 81  Resp: 16 18  Temp: 99 F (37.2 C) 99 F (37.2 C)   patient appears in NAD  Impression: no new issues  Plan: 1.  No changes, if culture remains negative, no indication for any antibiotics. Will be final tomorrow night.  Ok from an ID standpoint to monitor as outpatient.  At this time, I will sign off, please call with any new issues. thanks

## 2017-05-28 NOTE — Discharge Summary (Signed)
Date of Admission: 05/23/2017  Date of Discharge: 05/28/17  Pre-operative diagnosis: Left parietal subacute subdural hematoma  Postoperative diagnosis: Same  Procedure: Left burr hole craniectomy for evacuation of a left subacute subdural hematoma  Attending: Donalee Citrinram, Gary, MD  Hospital Course:  The patient was admitted for the above listed operation and had an uncomplicated post-operative course.  They were discharged in stable condition. Cultures were followed and did not show growth  Follow up: 2 weeks  Allergies as of 05/28/2017   No Known Allergies     Medication List    TAKE these medications   HYDROcodone-acetaminophen 5-325 MG tablet Commonly known as:  NORCO/VICODIN Take 1-2 tablets by mouth every 4 (four) hours as needed for moderate pain.

## 2017-05-28 NOTE — Progress Notes (Signed)
Discharge orders received.  Discharge instructions and follow-up appointments reviewed with the patient.  VSS upon discharge.  IV removed and education complete.  All belongings sent with the patient.  Transported out via wheelchair. Silva, LaToya M, RN   

## 2017-05-28 NOTE — Care Management Note (Signed)
Case Management Note  Patient Details  Name: Gabriel Fox MRN: [id number] Date of Birth: 12/22/1985  Subjective/Objective:                    Action/Plan: Pt discharging home with self care. Pt without insurance and no PCP. CM [facility]. Patient to call and schedule an appointment. CM also informed him of [facility] with [facility].  Pt discharging on Norco. [facility] will not assist with this medication. CM provided him coupons for [facility] and [facility]. Pt states he can afford the medication with the coupons.  Pt states he has transportation home today.   Expected Discharge Date:  05/28/17               Expected Discharge Plan:  Home/Self Care  In-House Referral:     Discharge planning Services  CM Consult, Medication Assistance  Post Acute Care Choice:    Choice offered to:     DME Arranged:    DME Agency:     HH Arranged:    HH Agency:     Status of Service:  Completed, signed off  If discussed at [employer] Length of Stay Meetings, dates discussed:    Additional Comments:  Gabriel BaloKelli F Willard, RN 05/28/2017, 11:43 AM

## 2017-05-30 LAB — AEROBIC/ANAEROBIC CULTURE W GRAM STAIN (SURGICAL/DEEP WOUND): Culture: NO GROWTH

## 2017-05-30 LAB — HIV ANTIBODY (ROUTINE TESTING W REFLEX): HIV Screen 4th Generation wRfx: NONREACTIVE

## 2017-06-13 ENCOUNTER — Other Ambulatory Visit: Payer: Self-pay | Admitting: Student

## 2017-06-13 DIAGNOSIS — S065X9A Traumatic subdural hemorrhage with loss of consciousness of unspecified duration, initial encounter: Secondary | ICD-10-CM

## 2017-06-13 DIAGNOSIS — S065XAA Traumatic subdural hemorrhage with loss of consciousness status unknown, initial encounter: Secondary | ICD-10-CM

## 2017-06-22 ENCOUNTER — Ambulatory Visit
Admission: RE | Admit: 2017-06-22 | Discharge: 2017-06-22 | Disposition: A | Discharge status: Home / Self Care | Payer: No Typology Code available for payment source | Source: Ambulatory Visit | Attending: Student | Admitting: Student

## 2017-06-22 DIAGNOSIS — S065XAA Traumatic subdural hemorrhage with loss of consciousness status unknown, initial encounter: Secondary | ICD-10-CM

## 2017-06-22 DIAGNOSIS — S065X9A Traumatic subdural hemorrhage with loss of consciousness of unspecified duration, initial encounter: Secondary | ICD-10-CM

## 2017-06-27 ENCOUNTER — Other Ambulatory Visit: Payer: Self-pay | Admitting: Student

## 2017-06-27 DIAGNOSIS — S065X9A Traumatic subdural hemorrhage with loss of consciousness of unspecified duration, initial encounter: Secondary | ICD-10-CM

## 2017-08-14 ENCOUNTER — Ambulatory Visit
Admission: RE | Admit: 2017-08-14 | Discharge: 2017-08-14 | Disposition: A | Discharge status: Home / Self Care | Payer: No Typology Code available for payment source | Source: Ambulatory Visit | Attending: Student | Admitting: Student

## 2017-08-14 DIAGNOSIS — S065X9A Traumatic subdural hemorrhage with loss of consciousness of unspecified duration, initial encounter: Secondary | ICD-10-CM

## 2017-10-26 ENCOUNTER — Other Ambulatory Visit: Payer: Self-pay

## 2017-10-26 ENCOUNTER — Encounter (HOSPITAL_COMMUNITY): Payer: Self-pay | Admitting: *Deleted

## 2017-10-26 DIAGNOSIS — B9789 Other viral agents as the cause of diseases classified elsewhere: Secondary | ICD-10-CM | POA: Insufficient documentation

## 2017-10-26 DIAGNOSIS — F17200 Nicotine dependence, unspecified, uncomplicated: Secondary | ICD-10-CM | POA: Insufficient documentation

## 2017-10-26 DIAGNOSIS — J028 Acute pharyngitis due to other specified organisms: Secondary | ICD-10-CM | POA: Insufficient documentation

## 2017-10-26 NOTE — ED Triage Notes (Signed)
The pt is c/o a sore throat for 5 days getting worse today

## 2017-10-27 ENCOUNTER — Emergency Department (HOSPITAL_COMMUNITY)
Admission: EM | Admit: 2017-10-27 | Discharge: 2017-10-27 | Disposition: A | Discharge status: Home / Self Care | Payer: No Typology Code available for payment source | Attending: Emergency Medicine | Admitting: Emergency Medicine

## 2017-10-27 DIAGNOSIS — J029 Acute pharyngitis, unspecified: Secondary | ICD-10-CM

## 2017-10-27 LAB — RAPID STREP SCREEN (MED CTR MEBANE ONLY): Streptococcus, Group A Screen (Direct): NEGATIVE

## 2017-10-27 MED ORDER — DEXAMETHASONE SODIUM PHOSPHATE 10 MG/ML IJ SOLN
10.0000 mg | Freq: Once | INTRAMUSCULAR | Status: AC
Start: 2017-10-27 — End: 2017-10-27
  Administered 2017-10-27: 10 mg via INTRAMUSCULAR
  Filled 2017-10-27: qty 1

## 2017-10-27 NOTE — ED Provider Notes (Signed)
[facility] Provider Note   CSN: [id number] Arrival date & time: 10/26/17  2322     History   Chief Complaint Chief Complaint  Patient presents with  . Sore Throat    HPI Gabriel DouglasDonald G Runner is a 31 y.o. male.  The history is provided by the patient and medical records. No language interpreter was used.  Sore Throat  Pertinent negatives include no abdominal pain and no shortness of breath.   Gabriel DouglasDonald G Wotton is a 31 y.o. male  with no pertinent past medical history who presents to the Emergency Department complaining of constant, progressively worsening sore throat x5 days.  Associated symptoms include nasal congestion and dry cough.  Painful to swallow, but eating and drinking.  No fever or chills.  No known sick contacts.  No alleviating or aggravating factors.  History reviewed. No pertinent past medical history.  Patient Active Problem List   Diagnosis Date Noted  . Subdural hematoma (HCC) 05/24/2017  . SDH (subdural hematoma) (HCC) 05/24/2017    Past Surgical History:  Procedure Laterality Date  . APPENDECTOMY    . BURR HOLE Left 05/24/2017   Procedure: BURR HOLE Craniectomy;  Surgeon: Donalee Citrinram, Gary, MD;  [facility];  Service: Neurosurgery;  Laterality: Left;       Home Medications    Prior to Admission medications   Medication Sig Start Date End Date Taking? Authorizing Provider  HYDROcodone-acetaminophen (NORCO/VICODIN) 5-325 MG tablet Take 1-2 tablets by mouth every 4 (four) hours as needed for moderate pain. 05/28/17   Ditty, Loura HaltBenjamin Jared, MD    Family History No family history on file.  Social History Social History   Tobacco Use  . Smoking status: Current Every Day Smoker  . Smokeless tobacco: Never Used  Substance Use Topics  . Alcohol use: Yes    Comment: denies  . Drug use: Yes    Types: Marijuana    Comment: denies     Allergies   Patient has no known allergies.   Review of Systems Review of  Systems  Constitutional: Negative for chills and fever.  HENT: Positive for congestion and sore throat.   Respiratory: Positive for cough. Negative for shortness of breath.   Gastrointestinal: Negative for abdominal pain, diarrhea, nausea and vomiting.  Skin: Negative for rash.     Physical Exam Updated Vital Signs BP (!) 146/92 (BP Location: Right Arm)   Pulse 87   Temp 98.2 F (36.8 C) (Oral)   Resp 18   Ht 5\' 7"  (1.702 m)   Wt 93 kg (205 lb)   SpO2 99%   BMI 32.11 kg/m   Physical Exam  Constitutional: He is oriented to person, place, and time. He appears well-developed and well-nourished. No distress.  HENT:  Head: Normocephalic and atraumatic.  Oropharynx with erythema and tonsillar hypertrophy.  No exudates.  No PTA.  Tolerating secretions well.  Cardiovascular: Normal rate, regular rhythm and normal heart sounds.  No murmur heard. Pulmonary/Chest: Effort normal and breath sounds normal. No respiratory distress.  Lungs clear to auscultation bilaterally.  Abdominal: Soft. He exhibits no distension. There is no tenderness.  Musculoskeletal: He exhibits no edema.  Neurological: He is alert and oriented to person, place, and time.  Skin: Skin is warm and dry.  Nursing note and vitals reviewed.    ED Treatments / Results  Labs (all labs ordered are listed, but only abnormal results are displayed) Labs Reviewed  R[facility]ID [facility]CREEN ([facility])  CULTURE,  GROUP A STREP [facility])    EKG  EKG Interpretation None       Radiology No results found.  Procedures Procedures (including critical care time)  Medications Ordered in ED Medications  dexamethasone (DECADRON) injection 10 mg (not administered)     Initial Impression / Assessment and Plan / ED Course  I have reviewed the triage vital signs and the nursing notes.  Pertinent labs & imaging results that were available during my care of the patient were reviewed by me and considered in my medical decision  making (see chart for details).    Gabriel Fox is a 31 y.o. male who presents to ED for sore throat, cough, congestion x5 days.  On exam, patient is afebrile, hemodynamically stable with clear lung exam.  Oropharynx with erythema and tonsillar hypertrophy, but no exudates.  Handling secretions well and tolerating p.o. without difficulty.  Rapid strep negative.  Decadron given in ED. PCP follow-up if no improvement.  Symptomatic home care instructions discussed, reasons to return to ER discussed, all questions answered.   Final Clinical Impressions(s) / ED Diagnoses   Final diagnoses:  Viral pharyngitis    ED Discharge Orders    None       Ward, Chase PicketJaime Pilcher, PA-C 10/27/17 0105    Dione BoozeGlick, David, MD 10/27/17 [phone]

## 2017-10-27 NOTE — Discharge Instructions (Signed)
Alternate between Tylenol and ibuprofen as needed for pain. Gargle warm salt water and spit it out. It is very important to stay hydrated!  Follow up with your primary care doctor in 5-7 days for recheck of ongoing symptoms and return to emergency department if any new or worsening of symptoms develop or you have any additional concerns.  °

## 2017-10-27 NOTE — ED Notes (Signed)
Pt reports a sore throat that has been going on for 5 days and is only getting worse.

## 2017-10-29 LAB — CULTURE, GROUP A STREP (THRC)

## 2017-10-30 ENCOUNTER — Emergency Department (HOSPITAL_COMMUNITY)
Admission: EM | Admit: 2017-10-30 | Discharge: 2017-10-30 | Disposition: A | Discharge status: Home / Self Care | Payer: Self-pay | Attending: Emergency Medicine | Admitting: Emergency Medicine

## 2017-10-30 ENCOUNTER — Encounter (HOSPITAL_COMMUNITY): Payer: Self-pay

## 2017-10-30 ENCOUNTER — Other Ambulatory Visit: Payer: Self-pay

## 2017-10-30 ENCOUNTER — Emergency Department (HOSPITAL_COMMUNITY): Payer: Self-pay

## 2017-10-30 DIAGNOSIS — J039 Acute tonsillitis, unspecified: Secondary | ICD-10-CM | POA: Insufficient documentation

## 2017-10-30 DIAGNOSIS — F17200 Nicotine dependence, unspecified, uncomplicated: Secondary | ICD-10-CM | POA: Insufficient documentation

## 2017-10-30 DIAGNOSIS — J029 Acute pharyngitis, unspecified: Secondary | ICD-10-CM | POA: Insufficient documentation

## 2017-10-30 LAB — BASIC METABOLIC PANEL
Anion gap: 8 (ref 5–15)
BUN: 14 mg/dL (ref 6–20)
CO2: 25 mmol/L (ref 22–32)
Calcium: 9.3 mg/dL (ref 8.9–10.3)
Chloride: 104 mmol/L (ref 101–111)
Creatinine, Ser: 1.21 mg/dL (ref 0.61–1.24)
GFR calc Af Amer: >60 mL/min (ref >60)
GFR calc non Af Amer: >60 mL/min (ref >60)
Glucose, Bld: 96 mg/dL (ref 65–99)
Potassium: 3.8 mmol/L (ref 3.5–5.1)
Sodium: 137 mmol/L (ref 135–145)

## 2017-10-30 LAB — CBC
HCT: 40.1 % (ref 39.0–52.0)
Hemoglobin: 13 g/dL (ref 13.0–17.0)
MCH: 24.9 pg — ABNORMAL LOW (ref 26.0–34.0)
MCHC: 32.4 g/dL (ref 30.0–36.0)
MCV: 76.7 fL — ABNORMAL LOW (ref 78.0–100.0)
Platelets: 263 10*3/uL (ref 150–400)
RBC: 5.23 MIL/uL (ref 4.22–5.81)
RDW: 15.1 % (ref 11.5–15.5)
WBC: 7.1 10*3/uL (ref 4.0–10.5)

## 2017-10-30 LAB — RAPID STREP SCREEN (MED CTR MEBANE ONLY): Streptococcus, Group A Screen (Direct): NEGATIVE

## 2017-10-30 MED ORDER — KETOROLAC TROMETHAMINE 30 MG/ML IJ SOLN
30.0000 mg | Freq: Once | INTRAMUSCULAR | Status: AC
  Administered 2017-10-30: 30 mg via INTRAVENOUS
  Filled 2017-10-30: qty 1

## 2017-10-30 MED ORDER — HYDROCODONE-ACETAMINOPHEN 5-325 MG PO TABS: 1.0000 | ORAL_TABLET | Freq: Four times a day (QID) | ORAL | 0 refills | Status: DC | PRN

## 2017-10-30 MED ORDER — HYDROCODONE-ACETAMINOPHEN 5-325 MG PO TABS
1.0000 | ORAL_TABLET | Freq: Once | ORAL | Status: AC
  Administered 2017-10-30: 1 via ORAL
  Filled 2017-10-30: qty 1

## 2017-10-30 MED ORDER — IOPAMIDOL (ISOVUE-300) INJECTION 61%
INTRAVENOUS | Status: AC
  Administered 2017-10-30: 75 mL via INTRAVENOUS
  Filled 2017-10-30: qty 75

## 2017-10-30 MED ORDER — CLINDAMYCIN HCL 150 MG PO CAPS: 450.0000 mg | ORAL_CAPSULE | Freq: Three times a day (TID) | ORAL | 0 refills | Status: DC

## 2017-10-30 MED ORDER — SODIUM CHLORIDE 0.9 % IV BOLUS (SEPSIS)
1000.0000 mL | Freq: Once | INTRAVENOUS | Status: AC
  Administered 2017-10-30: 1000 mL via INTRAVENOUS

## 2017-10-30 NOTE — ED Notes (Signed)
Pt returned from ct

## 2017-10-30 NOTE — Discharge Instructions (Signed)
1. Medications: Vicodin only for severe pain; clindamycin (complete the entire antibiotic), usual home medications 2. Treatment: rest, drink plenty of fluids,  3. Follow Up: Please followup with your primary doctor in 2-3 days for discussion of your diagnoses and further evaluation after today's visit; if you do not have a primary care doctor use the resource guide provided to find one; Please return to the ER for worsening pain, inability to swallow, high fevers or other concerns

## 2017-10-30 NOTE — ED Triage Notes (Signed)
Pt here for swelling tonsils, seen for same a few days ago and had decadron and negative strep test.

## 2017-10-30 NOTE — ED Notes (Signed)
  Pt transported to ct 

## 2017-10-30 NOTE — ED Notes (Signed)
ED Provider at bedside. 
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
## 2017-11-01 LAB — CULTURE, GROUP A STREP (THRC)

## 2017-11-23 ENCOUNTER — Encounter (HOSPITAL_COMMUNITY): Payer: Self-pay

## 2017-11-23 ENCOUNTER — Other Ambulatory Visit: Payer: Self-pay

## 2017-11-23 DIAGNOSIS — Y999 Unspecified external cause status: Secondary | ICD-10-CM | POA: Insufficient documentation

## 2017-11-23 DIAGNOSIS — S21219A Laceration without foreign body of unspecified back wall of thorax without penetration into thoracic cavity, initial encounter: Secondary | ICD-10-CM | POA: Insufficient documentation

## 2017-11-23 DIAGNOSIS — Y939 Activity, unspecified: Secondary | ICD-10-CM | POA: Insufficient documentation

## 2017-11-23 DIAGNOSIS — Z23 Encounter for immunization: Secondary | ICD-10-CM | POA: Insufficient documentation

## 2017-11-23 DIAGNOSIS — F17200 Nicotine dependence, unspecified, uncomplicated: Secondary | ICD-10-CM | POA: Insufficient documentation

## 2017-11-23 DIAGNOSIS — Y929 Unspecified place or not applicable: Secondary | ICD-10-CM | POA: Insufficient documentation
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
## 2017-11-24 ENCOUNTER — Emergency Department (HOSPITAL_COMMUNITY)
Admission: EM | Admit: 2017-11-24 | Discharge: 2017-11-24 | Disposition: A | Discharge status: Home / Self Care | Payer: No Typology Code available for payment source | Attending: Emergency Medicine | Admitting: Emergency Medicine

## 2017-11-24 DIAGNOSIS — S21219A Laceration without foreign body of unspecified back wall of thorax without penetration into thoracic cavity, initial encounter: Secondary | ICD-10-CM

## 2017-11-24 DIAGNOSIS — T148XXA Other injury of unspecified body region, initial encounter: Secondary | ICD-10-CM

## 2017-11-24 MED ORDER — NAPROXEN 500 MG PO TABS: 500.0000 mg | ORAL_TABLET | Freq: Two times a day (BID) | ORAL | 0 refills | Status: DC

## 2017-11-24 MED ORDER — BACITRACIN ZINC 500 UNIT/GM EX OINT: 1.0000 "application " | TOPICAL_OINTMENT | Freq: Two times a day (BID) | CUTANEOUS | 0 refills | Status: DC

## 2017-11-24 MED ORDER — ACETAMINOPHEN 325 MG PO TABS: 650.0000 mg | ORAL_TABLET | Freq: Four times a day (QID) | ORAL | 0 refills | Status: DC | PRN

## 2017-11-24 MED ORDER — NAPROXEN 250 MG PO TABS
500.0000 mg | ORAL_TABLET | Freq: Once | ORAL | Status: AC
  Administered 2017-11-24: 500 mg via ORAL
  Filled 2017-11-24: qty 2

## 2017-11-24 MED ORDER — TETANUS-DIPHTH-ACELL PERTUSSIS 5-2.5-18.5 LF-MCG/0.5 IM SUSP
0.5000 mL | Freq: Once | INTRAMUSCULAR | Status: AC
  Administered 2017-11-24: 0.5 mL via INTRAMUSCULAR
  Filled 2017-11-24: qty 0.5

## 2017-11-24 MED ORDER — SULFAMETHOXAZOLE-TRIMETHOPRIM 800-160 MG PO TABS: 1.0000 | ORAL_TABLET | Freq: Two times a day (BID) | ORAL | 0 refills | Status: AC

## 2017-11-24 NOTE — ED Notes (Signed)
Dr. Nanavati at bedside 
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
## 2017-11-24 NOTE — Discharge Instructions (Signed)
We saw you in the ER for your WOUND. °Please read the instructions provided on wound care. °Keep the area clean and dry, apply bacitracin ointment daily and take the medications provided. °RETURN TO THE ER IF THERE IS INCREASED PAIN, REDNESS, PUS COMING OUT from the wound site.  °

## 2018-12-29 ENCOUNTER — Encounter (HOSPITAL_COMMUNITY): Payer: Self-pay | Admitting: Radiology

## 2018-12-29 ENCOUNTER — Emergency Department (HOSPITAL_COMMUNITY): Payer: Self-pay

## 2018-12-29 ENCOUNTER — Emergency Department (HOSPITAL_COMMUNITY)
Admission: EM | Admit: 2018-12-29 | Discharge: 2018-12-29 | Disposition: A | Discharge status: Home / Self Care | Payer: Self-pay | Attending: Emergency Medicine | Admitting: Emergency Medicine

## 2018-12-29 DIAGNOSIS — F17210 Nicotine dependence, cigarettes, uncomplicated: Secondary | ICD-10-CM | POA: Insufficient documentation

## 2018-12-29 DIAGNOSIS — Y929 Unspecified place or not applicable: Secondary | ICD-10-CM | POA: Insufficient documentation

## 2018-12-29 DIAGNOSIS — Y999 Unspecified external cause status: Secondary | ICD-10-CM | POA: Insufficient documentation

## 2018-12-29 DIAGNOSIS — S21111A Laceration without foreign body of right front wall of thorax without penetration into thoracic cavity, initial encounter: Secondary | ICD-10-CM | POA: Insufficient documentation

## 2018-12-29 DIAGNOSIS — Y939 Activity, unspecified: Secondary | ICD-10-CM | POA: Insufficient documentation

## 2018-12-29 LAB — PROTIME-INR
INR: 0.91
Prothrombin Time: 12.2 seconds (ref 11.4–15.2)

## 2018-12-29 LAB — CBC
HCT: 45.1 % (ref 39.0–52.0)
Hemoglobin: 13.4 g/dL (ref 13.0–17.0)
MCH: 24.1 pg — ABNORMAL LOW (ref 26.0–34.0)
MCHC: 29.7 g/dL — ABNORMAL LOW (ref 30.0–36.0)
MCV: 81 fL (ref 80.0–100.0)
Platelets: 214 10*3/uL (ref 150–400)
RBC: 5.57 MIL/uL (ref 4.22–5.81)
RDW: 14.9 % (ref 11.5–15.5)
WBC: 5.6 10*3/uL (ref 4.0–10.5)
nRBC: 0 % (ref 0.0–0.2)

## 2018-12-29 LAB — COMPREHENSIVE METABOLIC PANEL
ALT: 27 U/L (ref 0–44)
AST: 28 U/L (ref 15–41)
Albumin: 4 g/dL (ref 3.5–5.0)
Alkaline Phosphatase: 55 U/L (ref 38–126)
Anion gap: 8 (ref 5–15)
BUN: 11 mg/dL (ref 6–20)
CO2: 19 mmol/L — ABNORMAL LOW (ref 22–32)
Calcium: 9 mg/dL (ref 8.9–10.3)
Chloride: 110 mmol/L (ref 98–111)
Creatinine, Ser: 1.28 mg/dL — ABNORMAL HIGH (ref 0.61–1.24)
GFR calc Af Amer: >60 mL/min (ref >60)
GFR calc non Af Amer: >60 mL/min (ref >60)
Glucose, Bld: 112 mg/dL — ABNORMAL HIGH (ref 70–99)
Potassium: 3.8 mmol/L (ref 3.5–5.1)
Sodium: 137 mmol/L (ref 135–145)
Total Bilirubin: 0.7 mg/dL (ref 0.3–1.2)
Total Protein: 6.9 g/dL (ref 6.5–8.1)

## 2018-12-29 LAB — ETHANOL: Alcohol, Ethyl (B): <10 mg/dL (ref <10)

## 2018-12-29 LAB — LACTIC ACID, PLASMA: Lactic Acid, Venous: 1.8 mmol/L (ref 0.5–1.9)

## 2018-12-29 LAB — ABO/RH: ABO/RH(D): O POS

## 2018-12-29 LAB — CDS SEROLOGY

## 2018-12-29 MED ORDER — IOHEXOL 300 MG/ML  SOLN
75.0000 mL | Freq: Once | INTRAMUSCULAR | Status: AC | PRN
  Administered 2018-12-29: 75 mL via INTRAVENOUS

## 2018-12-29 MED ORDER — CEPHALEXIN 500 MG PO CAPS: 500.0000 mg | ORAL_CAPSULE | Freq: Three times a day (TID) | ORAL | 0 refills | Status: DC
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
## 2018-12-29 NOTE — Discharge Instructions (Addendum)
Staple removal in 10 days.

## 2018-12-29 NOTE — Progress Notes (Signed)
   12/29/18 1500  Clinical Encounter Type  Visited With Patient;Health care provider  No family present. Provided the ministry of presence.
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
## 2018-12-29 NOTE — ED Notes (Signed)
Patient transported to CT 
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
## 2018-12-30 LAB — TYPE AND SCREEN
ABO/RH(D): O POS
Antibody Screen: NEGATIVE
Unit division: 0
Unit division: 0

## 2018-12-30 LAB — BPAM FFP
Blood Product Expiration Date: 202003122359
Blood Product Expiration Date: 202003132359
ISSUE DATE / TIME: 202002231251
ISSUE DATE / TIME: 202002231251
Unit Type and Rh: 600
Unit Type and Rh: 6200

## 2018-12-30 LAB — PREPARE FRESH FROZEN PLASMA
Unit division: 0
Unit division: 0

## 2018-12-30 LAB — BPAM RBC
Blood Product Expiration Date: 202003052359
Blood Product Expiration Date: 202003082359
ISSUE DATE / TIME: 202002231251
ISSUE DATE / TIME: 202002231251
Unit Type and Rh: 9500
Unit Type and Rh: 9500

## 2019-01-14 ENCOUNTER — Other Ambulatory Visit: Payer: Self-pay

## 2019-01-14 ENCOUNTER — Emergency Department (HOSPITAL_COMMUNITY)
Admission: EM | Admit: 2019-01-14 | Discharge: 2019-01-14 | Disposition: A | Discharge status: Home / Self Care | Payer: Self-pay | Attending: Emergency Medicine | Admitting: Emergency Medicine

## 2019-01-14 DIAGNOSIS — Z4802 Encounter for removal of sutures: Secondary | ICD-10-CM | POA: Insufficient documentation

## 2019-01-14 DIAGNOSIS — F17200 Nicotine dependence, unspecified, uncomplicated: Secondary | ICD-10-CM | POA: Insufficient documentation

## 2019-01-14 NOTE — ED Triage Notes (Signed)
Pt in requesting x 2 staple removal to R upper chest, edges approximated, no redness, swelling, drainage or fever present or reported, A&O x4

## 2019-01-14 NOTE — Discharge Instructions (Addendum)
Return to the emergency room with any new, worsening, concerning symptoms. °
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
## 2019-04-09 ENCOUNTER — Emergency Department (HOSPITAL_COMMUNITY)
Admission: EM | Admit: 2019-04-09 | Discharge: 2019-04-09 | Disposition: A | Discharge status: Home / Self Care | Payer: HRSA Program | Attending: Emergency Medicine | Admitting: Emergency Medicine

## 2019-04-09 ENCOUNTER — Encounter (HOSPITAL_COMMUNITY): Payer: Self-pay | Admitting: *Deleted

## 2019-04-09 DIAGNOSIS — Z20828 Contact with and (suspected) exposure to other viral communicable diseases: Secondary | ICD-10-CM | POA: Insufficient documentation

## 2019-04-09 DIAGNOSIS — R197 Diarrhea, unspecified: Secondary | ICD-10-CM | POA: Diagnosis not present

## 2019-04-09 DIAGNOSIS — R531 Weakness: Secondary | ICD-10-CM | POA: Diagnosis not present

## 2019-04-09 DIAGNOSIS — R0602 Shortness of breath: Secondary | ICD-10-CM | POA: Diagnosis not present

## 2019-04-09 DIAGNOSIS — F17200 Nicotine dependence, unspecified, uncomplicated: Secondary | ICD-10-CM | POA: Insufficient documentation

## 2019-04-09 DIAGNOSIS — F1210 Cannabis abuse, uncomplicated: Secondary | ICD-10-CM | POA: Insufficient documentation

## 2019-04-09 DIAGNOSIS — Z20822 Contact with and (suspected) exposure to covid-19: Secondary | ICD-10-CM

## 2019-04-09 DIAGNOSIS — R05 Cough: Secondary | ICD-10-CM | POA: Diagnosis present

## 2019-04-09 NOTE — ED Notes (Signed)
Pt verbalized understanding of discharge paperwork and follow-up care.  °

## 2019-04-09 NOTE — Discharge Instructions (Addendum)
Please stay at home for at least 10 days from your symptoms onset AND Your symptoms are improved AND You have not had a fever for 72 hours or more

## 2019-04-09 NOTE — ED Triage Notes (Signed)
Pt in c/o cough, fatigue and body aches, reports his co-worker tested positive for Covid and he has close exposure to them, no distress noted

## 2019-04-09 NOTE — ED Provider Notes (Signed)
[facility] Provider Note   CSN: [id number] Arrival date & time: 04/09/19  1157    History   Chief Complaint Chief Complaint  Patient presents with  . Cough    HPI Sharolyn DouglasDonald G Pupo is a 33 y.o. male.     Patient is a 33 year old gentleman presenting to the emergency department for exposure to COVID-19.  Patient reports that his boss at work tested positive.  Reports that the last couple of days he has had a few episodes of diarrhea as well as feeling generalized weakness.  Denies any coughing, fever, chills, nausea, vomiting.  He does endorse some shortness of breath.  He is afraid that he might test positive.     History reviewed. No pertinent past medical history.  Patient Active Problem List   Diagnosis Date Noted  . Subdural hematoma (HCC) 05/24/2017  . SDH (subdural hematoma) (HCC) 05/24/2017    Past Surgical History:  Procedure Laterality Date  . APPENDECTOMY    . BURR HOLE Left 05/24/2017   Procedure: BURR HOLE Craniectomy;  Surgeon: Donalee Citrinram, Gary, MD;  [facility];  Service: Neurosurgery;  Laterality: Left;        Home Medications    Prior to Admission medications   Medication Sig Start Date End Date Taking? Authorizing Provider  acetaminophen (TYLENOL) 325 MG tablet Take 2 tablets (650 mg total) by mouth every 6 (six) hours as needed. 11/24/17   Derwood KaplanNanavati, Ankit, MD  bacitracin ointment Apply 1 application topically 2 (two) times daily. 11/24/17   Derwood KaplanNanavati, Ankit, MD  cephALEXin (KEFLEX) 500 MG capsule Take 1 capsule (500 mg total) by mouth 3 (three) times daily. 12/29/18   Azalia Bilisampos, Kevin, MD  clindamycin (CLEOCIN) 150 MG capsule Take 3 capsules (450 mg total) by mouth 3 (three) times daily. 10/30/17   Muthersbaugh, Dahlia ClientHannah, PA-C  HYDROcodone-acetaminophen (NORCO/VICODIN) 5-325 MG tablet Take 1 tablet by mouth every 6 (six) hours as needed for severe pain. 10/30/17   Muthersbaugh, Dahlia ClientHannah, PA-C  naproxen (NAPROSYN) 500 MG  tablet Take 1 tablet (500 mg total) by mouth 2 (two) times daily. 11/24/17   Derwood KaplanNanavati, Ankit, MD    Family History History reviewed. No pertinent family history.  Social History Social History   Tobacco Use  . Smoking status: Current Every Day Smoker  . Smokeless tobacco: Never Used  Substance Use Topics  . Alcohol use: Yes    Comment: denies  . Drug use: Yes    Types: Marijuana    Comment: denies     Allergies   Patient has no known allergies.   Review of Systems Review of Systems  Constitutional: Positive for fatigue. Negative for appetite change and fever.  HENT: Negative for ear pain.   Respiratory: Positive for shortness of breath. Negative for apnea, cough and wheezing.   Cardiovascular: Negative for chest pain and palpitations.  Gastrointestinal: Positive for diarrhea. Negative for abdominal pain, nausea and vomiting.  Genitourinary: Negative for dysuria.  Musculoskeletal: Negative for back pain.  Skin: Negative for rash and wound.  Allergic/Immunologic: Negative for immunocompromised state.  Neurological: Negative for dizziness, weakness, light-headedness and headaches.     Physical Exam Updated Vital Signs BP (!) 138/95 (BP Location: Right Arm)   Pulse 64   Temp 97.7 F (36.5 C) (Oral)   Resp 16   SpO2 98%   Physical Exam Vitals signs and nursing note reviewed.  Constitutional:      Appearance: Normal appearance.  HENT:     Head: Normocephalic.  Mouth/Throat:     Mouth: Mucous membranes are moist.     Pharynx: Oropharynx is clear.  Eyes:     Conjunctiva/sclera: Conjunctivae normal.  Cardiovascular:     Rate and Rhythm: Normal rate and regular rhythm.  Pulmonary:     Effort: Pulmonary effort is normal.     Breath sounds: Normal breath sounds.  Skin:    General: Skin is dry.  Neurological:     Mental Status: He is alert.  Psychiatric:        Mood and Affect: Mood normal.      ED Treatments / Results  Labs (all labs ordered are  listed, but only abnormal results are displayed) Labs Reviewed  NOVEL CORONAVIRUS, NAA (HOSPITAL ORDER, SEND-OUT TO REF LAB)    EKG None  Radiology No results found.  Procedures Procedures (including critical care time)  Medications Ordered in ED Medications - No data to display   Initial Impression / Assessment and Plan / ED Course  I have reviewed the triage vital signs and the nursing notes.  Pertinent labs & imaging results that were available during my care of the patient were reviewed by me and considered in my medical decision making (see chart for details).  Clinical Course as of Apr 08 1341  Wed Apr 09, 2019  1341 Suspected covid. Does not meet admission criteria. Vitals stable. Advised on return precautions and quaratine instructions   [KM]    Clinical Course User Index [KM] Arlyn Dunning, PA-C         Final Clinical Impressions(s) / ED Diagnoses   Final diagnoses:  Suspected Covid-19 Virus Infection    ED Discharge Orders    None       Arlyn Dunning, PA-C 04/09/19 1342    Charlynne Pander, MD 04/09/19 1622

## 2019-04-10 LAB — NOVEL CORONAVIRUS, NAA (HOSP ORDER, SEND-OUT TO REF LAB; TAT 18-24 HRS): SARS-CoV-2, NAA: NOT DETECTED

## 2019-11-27 ENCOUNTER — Ambulatory Visit: Payer: Self-pay | Attending: Internal Medicine

## 2019-11-27 DIAGNOSIS — Z20822 Contact with and (suspected) exposure to covid-19: Secondary | ICD-10-CM | POA: Insufficient documentation

## 2019-11-28 LAB — NOVEL CORONAVIRUS, NAA: SARS-CoV-2, NAA: NOT DETECTED

## 2019-12-08 ENCOUNTER — Ambulatory Visit: Payer: Self-pay | Attending: Internal Medicine

## 2019-12-08 DIAGNOSIS — Z20822 Contact with and (suspected) exposure to covid-19: Secondary | ICD-10-CM | POA: Insufficient documentation

## 2019-12-09 LAB — NOVEL CORONAVIRUS, NAA: SARS-CoV-2, NAA: NOT DETECTED

## 2020-02-04 ENCOUNTER — Other Ambulatory Visit: Payer: Self-pay

## 2020-02-04 ENCOUNTER — Ambulatory Visit (HOSPITAL_COMMUNITY)
Admission: EM | Admit: 2020-02-04 | Discharge: 2020-02-04 | Disposition: A | Discharge status: Home / Self Care | Payer: Self-pay | Attending: Family Medicine | Admitting: Family Medicine

## 2020-02-04 ENCOUNTER — Encounter (HOSPITAL_COMMUNITY): Payer: Self-pay

## 2020-02-04 DIAGNOSIS — R369 Urethral discharge, unspecified: Secondary | ICD-10-CM | POA: Insufficient documentation

## 2020-02-04 DIAGNOSIS — Z7251 High risk heterosexual behavior: Secondary | ICD-10-CM | POA: Insufficient documentation

## 2020-02-04 MED ORDER — LIDOCAINE HCL (PF) 1 % IJ SOLN
INTRAMUSCULAR | Status: AC
  Filled 2020-02-04: qty 2

## 2020-02-04 MED ORDER — DOXYCYCLINE HYCLATE 100 MG PO CAPS: 100.0000 mg | ORAL_CAPSULE | Freq: Two times a day (BID) | ORAL | 0 refills | Status: DC

## 2020-02-04 MED ORDER — CEFTRIAXONE SODIUM 500 MG IJ SOLR
500.0000 mg | Freq: Once | INTRAMUSCULAR | Status: AC
  Administered 2020-02-04: 500 mg via INTRAMUSCULAR

## 2020-02-04 MED ORDER — CEFTRIAXONE SODIUM 500 MG IJ SOLR
INTRAMUSCULAR | Status: AC
  Filled 2020-02-04: qty 500

## 2020-02-04 NOTE — Discharge Instructions (Signed)

## 2020-02-04 NOTE — ED Triage Notes (Signed)
C/o penile discharge starting on Sunday.

## 2020-02-04 NOTE — ED Provider Notes (Signed)
[place]   [id number] 02/04/20 Arrival Time: 7062  ASSESSMENT & PLAN:  1. Penile discharge   2. High risk heterosexual behavior       Discharge Instructions     You have been given the following today for treatment of suspected gonorrhea and/or chlamydia:  cefTRIAXone (ROCEPHIN) injection 500 mg  Please pick up your prescription for doxycycline 100 mg and begin taking twice daily for the next seven (7) days.  Even though we have treated you today, we have sent testing for sexually transmitted infections. We will notify you of any positive results once they are received. If required, we will prescribe any medications you might need.  Please refrain from all sexual activity for at least the next seven days.     Pending: Labs Reviewed  CYTOLOGY, (ORAL, ANAL, URETHRAL) ANCILLARY ONLY    Will notify of any positive results. Instructed to refrain from sexual activity for at least seven days.  Reviewed expectations re: course of current medical issues. Questions answered. Outlined signs and symptoms indicating need for more acute intervention. Patient verbalized understanding. After Visit Summary given.   SUBJECTIVE:  Gabriel Fox is a 34 y.o. male who presents with complaint of penile discharge. Onset abrupt. First noticed 3 d ago. Describes discharge as thick and opaque, white and yellow. No specific aggravating or alleviating factors reported. Denies: urinary frequency, dysuria and gross hematuria. Afebrile. No abdominal or pelvic pain. No n/v. No rashes or lesions. Reports that he is sexually active with multiple male partners. OTC treatment: none. History of STI: none reported.   OBJECTIVE:  Vitals:   02/04/20 1229  BP: 131/88  Pulse: 85  Resp: 19  Temp: 98.2 F (36.8 C)  TempSrc: Oral  SpO2: 100%     General appearance: alert, cooperative, appears stated age and no distress Lungs: unlabored respirations Back: FROM at waist GU: normal  genitalia Skin: warm and dry Psychological: alert and cooperative; normal mood and affect.    Labs Reviewed  CYTOLOGY, (ORAL, ANAL, URETHRAL) ANCILLARY ONLY    No Known Allergies  History reviewed. No pertinent past medical history.   No family history on file. Social History   Socioeconomic History  . Marital status: Single    Spouse name: Not on file  . Number of children: Not on file  . Years of education: Not on file  . Highest education level: Not on file  Occupational History  . Not on file  Tobacco Use  . Smoking status: Current Some Day Smoker  . Smokeless tobacco: Never Used  . Tobacco comment: stress  Substance and Sexual Activity  . Alcohol use: Yes    Comment: denies  . Drug use: Yes    Types: Marijuana    Comment: denies  . Sexual activity: Not on file  Other Topics Concern  . Not on file  Social History Narrative  . Not on file   Social Determinants of Health   Financial Resource Strain:   . Difficulty of Paying Living Expenses:   Food Insecurity:   . Worried About Charity fundraiser in the Last Year:   . Arboriculturist in the Last Year:   Transportation Needs:   . Film/video editor (Medical):   Marland Kitchen Lack of Transportation (Non-Medical):   Physical Activity:   . Days of Exercise per Week:   . Minutes of Exercise per Session:   Stress:   . Feeling of Stress :   Social Connections:   .  Frequency of Communication with Friends and Family:   . Frequency of Social Gatherings with Friends and Family:   . Attends Religious Services:   . Active Member of Clubs or Organizations:   . Attends Banker Meetings:   Marland Kitchen Marital Status:   Intimate Partner Violence:   . Fear of Current or Ex-Partner:   . Emotionally Abused:   Marland Kitchen Physically Abused:   . Sexually Abused:           Mardella Layman, MD 02/04/20 1248

## 2020-02-06 LAB — CYTOLOGY, (ORAL, ANAL, URETHRAL) ANCILLARY ONLY
Chlamydia: POSITIVE — AB
Neisseria Gonorrhea: POSITIVE — AB
Trichomonas: NEGATIVE

## 2020-03-10 ENCOUNTER — Emergency Department (HOSPITAL_COMMUNITY)
Admission: EM | Admit: 2020-03-10 | Discharge: 2020-03-10 | Disposition: A | Discharge status: Home / Self Care | Payer: Self-pay | Attending: Emergency Medicine | Admitting: Emergency Medicine

## 2020-03-10 ENCOUNTER — Emergency Department (HOSPITAL_COMMUNITY): Payer: Self-pay

## 2020-03-10 ENCOUNTER — Encounter (HOSPITAL_COMMUNITY): Payer: Self-pay

## 2020-03-10 ENCOUNTER — Other Ambulatory Visit: Payer: Self-pay

## 2020-03-10 DIAGNOSIS — R079 Chest pain, unspecified: Secondary | ICD-10-CM

## 2020-03-10 DIAGNOSIS — F17210 Nicotine dependence, cigarettes, uncomplicated: Secondary | ICD-10-CM | POA: Insufficient documentation

## 2020-03-10 DIAGNOSIS — R0789 Other chest pain: Secondary | ICD-10-CM | POA: Insufficient documentation

## 2020-03-10 LAB — CBC
HCT: 42.9 % (ref 39.0–52.0)
Hemoglobin: 13.7 g/dL (ref 13.0–17.0)
MCH: 25.6 pg — ABNORMAL LOW (ref 26.0–34.0)
MCHC: 31.9 g/dL (ref 30.0–36.0)
MCV: 80.2 fL (ref 80.0–100.0)
Platelets: 227 10*3/uL (ref 150–400)
RBC: 5.35 MIL/uL (ref 4.22–5.81)
RDW: 14.6 % (ref 11.5–15.5)
WBC: 4.3 10*3/uL (ref 4.0–10.5)
nRBC: 0 % (ref 0.0–0.2)

## 2020-03-10 LAB — BASIC METABOLIC PANEL
Anion gap: 9 (ref 5–15)
BUN: 10 mg/dL (ref 6–20)
CO2: 23 mmol/L (ref 22–32)
Calcium: 9.5 mg/dL (ref 8.9–10.3)
Chloride: 108 mmol/L (ref 98–111)
Creatinine, Ser: 1.13 mg/dL (ref 0.61–1.24)
GFR calc Af Amer: >60 mL/min (ref >60)
GFR calc non Af Amer: >60 mL/min (ref >60)
Glucose, Bld: 101 mg/dL — ABNORMAL HIGH (ref 70–99)
Potassium: 4.4 mmol/L (ref 3.5–5.1)
Sodium: 140 mmol/L (ref 135–145)

## 2020-03-10 LAB — D-DIMER, QUANTITATIVE: D-Dimer, Quant: <0.27 ug/mL-FEU (ref 0.00–0.50)

## 2020-03-10 LAB — TROPONIN I (HIGH SENSITIVITY): Troponin I (High Sensitivity): <2 ng/L (ref <18)

## 2020-03-10 MED ORDER — SODIUM CHLORIDE 0.9% FLUSH: 3.0000 mL | Freq: Once | INTRAVENOUS | Status: DC

## 2020-03-10 MED ORDER — ASPIRIN 325 MG PO TABS
325.0000 mg | ORAL_TABLET | Freq: Every day | ORAL | Status: DC
  Administered 2020-03-10: 325 mg via ORAL
  Filled 2020-03-10: qty 1

## 2020-03-10 NOTE — Discharge Instructions (Signed)
Your labwork and images was reassuring today. Your symptoms may be related to muscle soreness from heavy lifting. I would recommend taking Ibuprofen and Tylenol as needed for pain. Please refrain from excessive heavy lifting for the next week to allow some rest of your muscles. A work note has been provided.   Follow up with your PCP. If you do not have one you can follow up with [facility] for primary care needs.   Return to the ED IMMEDIATELY for any worsening symptoms including worsening chest pain, shortness of breath, nausea and vomiting, tearing pain into your back, radiation of pain into your jaws or arms, passing out, coughing up blood.

## 2020-03-10 NOTE — ED Provider Notes (Signed)
[facility]-EMERGENCY DEPT Provider Note   CSN: [id number] Arrival date & time: 03/10/20  1545     History Chief Complaint  Patient presents with  . Chest Pain    KOLTER REAVER is a 34 y.o. male who presents to the ED today with complaint of sudden onset, intermittent, sharp, substernal chest pain x 2-3 weeks. Pt reports that when the pain comes on he will "beat on my chest" which causes the pain to go away. He does mention that the pain has been more frequent currently causing him concern. Pt states that he has woken up out of his sleep the past couple of nights in a cold sweat however denies any subjective fevers or chills during the day. The pain is exacerbated mostly with movement. No shortness of breath, cough, nausea, vomiting, leg swelling, or any other associated symptoms. Pt is a former smoker; quit 2 years ago. He does occasionally smoke marijuana. No hx DVT/PE. Pt does mention he recently traveled to [place] for vacation. No hemoptysis. No active malignancy. No exogenous hormone use. No FHx of CAD.   The history is provided by the patient and medical records.       History reviewed. No pertinent past medical history.  Patient Active Problem List   Diagnosis Date Noted  . Subdural hematoma (HCC) 05/24/2017  . SDH (subdural hematoma) (HCC) 05/24/2017    Past Surgical History:  Procedure Laterality Date  . APPENDECTOMY    . BURR HOLE Left 05/24/2017   Procedure: BURR HOLE Craniectomy;  Surgeon: Donalee Citrin, MD;  Location: [facility] OR;  Service: Neurosurgery;  Laterality: Left;       History reviewed. No pertinent family history.  Social History   Tobacco Use  . Smoking status: Current Some Day Smoker  . Smokeless tobacco: Never Used  . Tobacco comment: stress  Substance Use Topics  . Alcohol use: Yes    Comment: denies  . Drug use: Yes    Types: Marijuana    Comment: denies    Home Medications Prior to Admission medications   Medication  Sig Start Date End Date Taking? Authorizing Provider  acetaminophen (TYLENOL) 325 MG tablet Take 2 tablets (650 mg total) by mouth every 6 (six) hours as needed. Patient not taking: Reported on 03/10/2020 11/24/17   Derwood Kaplan, MD  bacitracin ointment Apply 1 application topically 2 (two) times daily. Patient not taking: Reported on 03/10/2020 11/24/17   Derwood Kaplan, MD  cephALEXin (KEFLEX) 500 MG capsule Take 1 capsule (500 mg total) by mouth 3 (three) times daily. Patient not taking: Reported on 03/10/2020 12/29/18   Azalia Bilis, MD  clindamycin (CLEOCIN) 150 MG capsule Take 3 capsules (450 mg total) by mouth 3 (three) times daily. Patient not taking: Reported on 03/10/2020 10/30/17   Muthersbaugh, Dahlia Client, PA-C  doxycycline (VIBRAMYCIN) 100 MG capsule Take 1 capsule (100 mg total) by mouth 2 (two) times daily. Patient not taking: Reported on 03/10/2020 02/04/20   Mardella Layman, MD  HYDROcodone-acetaminophen (NORCO/VICODIN) 5-325 MG tablet Take 1 tablet by mouth every 6 (six) hours as needed for severe pain. Patient not taking: Reported on 03/10/2020 10/30/17   Muthersbaugh, Dahlia Client, PA-C  naproxen (NAPROSYN) 500 MG tablet Take 1 tablet (500 mg total) by mouth 2 (two) times daily. Patient not taking: Reported on 03/10/2020 11/24/17   Derwood Kaplan, MD    Allergies    Patient has no known allergies.  Review of Systems   Review of Systems  Constitutional: Negative for  chills and fever.  Respiratory: Negative for cough and shortness of breath.   Cardiovascular: Positive for chest pain. Negative for palpitations and leg swelling.  Gastrointestinal: Negative for abdominal pain, nausea and vomiting.  All other systems reviewed and are negative.   Physical Exam Updated Vital Signs BP 138/89   Pulse 85   Temp 97.8 F (36.6 C) (Oral)   Resp 20   SpO2 98%   Physical Exam Vitals and nursing note reviewed.  Constitutional:      Appearance: He is not ill-appearing or diaphoretic.  HENT:      Head: Normocephalic and atraumatic.  Eyes:     Conjunctiva/sclera: Conjunctivae normal.  Cardiovascular:     Rate and Rhythm: Normal rate and regular rhythm.     Pulses:          Radial pulses are 2+ on the right side and 2+ on the left side.       Dorsalis pedis pulses are 2+ on the right side and 2+ on the left side.  Pulmonary:     Effort: Pulmonary effort is normal.     Breath sounds: Normal breath sounds. No decreased breath sounds, wheezing, rhonchi or rales.  Abdominal:     Palpations: Abdomen is soft.     Tenderness: There is no abdominal tenderness. There is no guarding or rebound.  Musculoskeletal:     Cervical back: Neck supple.     Right lower leg: No tenderness. No edema.     Left lower leg: No tenderness. No edema.  Skin:    General: Skin is warm and dry.  Neurological:     Mental Status: He is alert.     ED Results / Procedures / Treatments   Labs (all labs ordered are listed, but only abnormal results are displayed) Labs Reviewed  BASIC METABOLIC PANEL - Abnormal; Notable for the following components:      Result Value   Glucose, Bld 101 (*)    All other components within normal limits  CBC - Abnormal; Notable for the following components:   MCH 25.6 (*)    All other components within normal limits  D-DIMER, QUANTITATIVE (NOT AT [facility])  TROPONIN I (HIGH SENSITIVITY)  TROPONIN I (HIGH SENSITIVITY)    EKG EKG Interpretation  Date/Time:  Wednesday Mar 10 2020 15:59:20 EDT Ventricular Rate:  85 PR Interval:    QRS Duration: 87 QT Interval:  344 QTC Calculation: 409 R Axis:   77 Text Interpretation: Sinus rhythm Confirmed by Virgina Norfolk [phone]) on 03/10/2020 5:39:19 PM   Radiology DG Chest 2 View  Result Date: 03/10/2020 CLINICAL DATA:  Chest pain EXAM: CHEST - 2 VIEW COMPARISON:  December 29, 2018 FINDINGS: The lungs are clear. The heart size and pulmonary vascularity are normal. No adenopathy. No pneumothorax. No bone lesions. IMPRESSION: No  abnormality noted. Electronically Signed   By: Bretta Bang III M.D.   On: 03/10/2020 17:01    Procedures Procedures (including critical care time)  Medications Ordered in ED Medications  sodium chloride flush (NS) 0.9 % injection 3 mL (has no administration in time range)  aspirin tablet 325 mg (325 mg Oral Given 03/10/20 1736)    ED Course  I have reviewed the triage vital signs and the nursing notes.  Pertinent labs & imaging results that were available during my care of the patient were reviewed by me and considered in my medical decision making (see chart for details).  Clinical Course as of Mar 10 1905  Wed  Mar 10, 2020  1759 D-Dimer, Quant: <0.27 [MV]  1816 Troponin I (High Sensitivity): <2 [MV]    Clinical Course User Index [MV] Tanda Rockers, PA-C   MDM Rules/Calculators/A&P                      34 year old male who presents to the ED today complaining of intermittent substernal chest pain for the past 2 to 3 weeks.  Ports he has been waking up in a cold sweat throughout the night however denies any fevers or chills during the day.  No other symptoms including shortness of breath, nausea, vomiting, leg swelling.  Does mention that he recently traveled to [place] therefore will obtain D-dimer.  I have low suspicion for ACS given young age and no family history however will work-up with troponin, EKG, chest x-ray.  Without any URI-like symptoms prior to chest pain beginning.  I doubt pericarditis versus myocarditis.  Patient has equal pulses and I doubt dissection today.  Patient does indicate that his chest pain is worsened with movement.  He does do heavy lifting at work and I mostly suspect that this is musculoskeletal in nature.  Arrival to the ED patient is afebrile, nontachycardic and nontachypneic.  He appears to be in no acute distress.  He has no obvious chest tenderness to palpation on exam today.  Continue to monitor.  Anticipate discharge home.   EKG without  ischemic changes today. X-ray clear.  BC without leukocytosis.  Hemoglobin stable.  BMP without electrolyte abnormalities today.  Creatinine is 1.13 however this appears to be patient's baseline. Dimer negative at 0.27.  He is low Wells risk.  Do not feel he needs imaging at this time.  Troponin less than 2.  Given chest pain ongoing for 2-3 weeks do not feel pt needs repeat trop at this time.  Feel pt is stable for discharge home at this time. Heart score of 1. Pt encouraged to follow up with PCP for further eval. Info given for [facility]. Pt advised to take Ibuprofen and Tylenol PRN for pain as I suspect musculoskeletal type pain more than anything else. He does do heavy lifting at work and will lift about 100 pound boxes. Will place on restrictions for the next week. Strict return precautions have been discussed with pt including worsening pain, radiation into back/arms/jaw, SOB, nausea, vomiting. He is in agreement with plan and stable for discharge home.   This note was prepared using Dragon voice recognition software and may include unintentional dictation errors due to the inherent limitations of voice recognition software.  Final Clinical Impression(s) / ED Diagnoses Final diagnoses:  Nonspecific chest pain  Chest wall pain    Rx / DC Orders ED Discharge Orders    None       Discharge Instructions     Your labwork and images was reassuring today. Your symptoms may be related to muscle soreness from heavy lifting. I would recommend taking Ibuprofen and Tylenol as needed for pain. Please refrain from excessive heavy lifting for the next week to allow some rest of your muscles. A work note has been provided.   Follow up with your PCP. If you do not have one you can follow up with [facility] for primary care needs.   Return to the ED IMMEDIATELY for any worsening symptoms including worsening chest pain, shortness of breath, nausea and vomiting, tearing  pain into your back, radiation of pain into your jaws  or arms, passing out, coughing up blood.        Eustaquio Maize, PA-C 03/10/20 1906    Lennice Sites, DO 03/10/20 1909

## 2020-03-10 NOTE — ED Notes (Signed)
An After Visit Summary was printed and given to the patient. Discharge instructions given and no further questions at this time.  Pt denies chest pain at this time.  

## 2020-03-10 NOTE — ED Triage Notes (Signed)
Patient arrived via POV.    Patient reports central intermitten chest pain X3 weeks   5/10 sharp pain that comes and goes Denies N/V or shob    A/Ox4 Ambulatory in triage

## 2020-03-22 ENCOUNTER — Emergency Department (HOSPITAL_COMMUNITY)
Admission: EM | Admit: 2020-03-22 | Discharge: 2020-03-23 | Disposition: A | Discharge status: Home / Self Care | Payer: Self-pay | Attending: Emergency Medicine | Admitting: Emergency Medicine

## 2020-03-22 ENCOUNTER — Encounter (HOSPITAL_COMMUNITY): Payer: Self-pay | Admitting: Emergency Medicine

## 2020-03-22 ENCOUNTER — Other Ambulatory Visit: Payer: Self-pay

## 2020-03-22 DIAGNOSIS — J189 Pneumonia, unspecified organism: Secondary | ICD-10-CM | POA: Insufficient documentation

## 2020-03-22 DIAGNOSIS — Z20822 Contact with and (suspected) exposure to covid-19: Secondary | ICD-10-CM | POA: Insufficient documentation

## 2020-03-22 LAB — CBC
HCT: 41.9 % (ref 39.0–52.0)
Hemoglobin: 13.5 g/dL (ref 13.0–17.0)
MCH: 25.2 pg — ABNORMAL LOW (ref 26.0–34.0)
MCHC: 32.2 g/dL (ref 30.0–36.0)
MCV: 78.2 fL — ABNORMAL LOW (ref 80.0–100.0)
Platelets: 184 10*3/uL (ref 150–400)
RBC: 5.36 MIL/uL (ref 4.22–5.81)
RDW: 14.3 % (ref 11.5–15.5)
WBC: 4.1 10*3/uL (ref 4.0–10.5)
nRBC: 0 % (ref 0.0–0.2)

## 2020-03-22 LAB — COMPREHENSIVE METABOLIC PANEL
ALT: 27 U/L (ref 0–44)
AST: 27 U/L (ref 15–41)
Albumin: 3.9 g/dL (ref 3.5–5.0)
Alkaline Phosphatase: 50 U/L (ref 38–126)
Anion gap: 14 (ref 5–15)
BUN: 7 mg/dL (ref 6–20)
CO2: 21 mmol/L — ABNORMAL LOW (ref 22–32)
Calcium: 8.8 mg/dL — ABNORMAL LOW (ref 8.9–10.3)
Chloride: 100 mmol/L (ref 98–111)
Creatinine, Ser: 1.28 mg/dL — ABNORMAL HIGH (ref 0.61–1.24)
GFR calc Af Amer: >60 mL/min (ref >60)
GFR calc non Af Amer: >60 mL/min (ref >60)
Glucose, Bld: 104 mg/dL — ABNORMAL HIGH (ref 70–99)
Potassium: 4 mmol/L (ref 3.5–5.1)
Sodium: 135 mmol/L (ref 135–145)
Total Bilirubin: 0.6 mg/dL (ref 0.3–1.2)
Total Protein: 7.6 g/dL (ref 6.5–8.1)

## 2020-03-22 LAB — LACTIC ACID, PLASMA: Lactic Acid, Venous: 0.7 mmol/L (ref 0.5–1.9)

## 2020-03-22 LAB — URINALYSIS, ROUTINE W REFLEX MICROSCOPIC
Bilirubin Urine: NEGATIVE
Glucose, UA: NEGATIVE mg/dL
Hgb urine dipstick: NEGATIVE
Ketones, ur: NEGATIVE mg/dL
Leukocytes,Ua: NEGATIVE
Nitrite: NEGATIVE
Protein, ur: NEGATIVE mg/dL
Specific Gravity, Urine: 1.002 — ABNORMAL LOW (ref 1.005–1.030)
pH: 6 (ref 5.0–8.0)

## 2020-03-22 LAB — LIPASE, BLOOD: Lipase: 20 U/L (ref 11–51)

## 2020-03-22 MED ORDER — ACETAMINOPHEN 325 MG PO TABS
650.0000 mg | ORAL_TABLET | Freq: Once | ORAL | Status: AC
  Administered 2020-03-22: 650 mg via ORAL
  Filled 2020-03-22: qty 2

## 2020-03-22 MED ORDER — SODIUM CHLORIDE 0.9% FLUSH
3.0000 mL | Freq: Once | INTRAVENOUS | Status: AC
  Administered 2020-03-23: 3 mL via INTRAVENOUS

## 2020-03-22 NOTE — ED Triage Notes (Signed)
Pt. Stated, Ive had night sweats, chills and Im dehydrated, This started 2 days ago.

## 2020-03-23 ENCOUNTER — Emergency Department (HOSPITAL_COMMUNITY): Payer: Self-pay

## 2020-03-23 LAB — POC SARS CORONAVIRUS 2 AG -  ED: SARS Coronavirus 2 Ag: NEGATIVE

## 2020-03-23 LAB — SARS CORONAVIRUS 2 BY RT PCR (HOSPITAL ORDER, PERFORMED IN ~~LOC~~ HOSPITAL LAB): SARS Coronavirus 2: NEGATIVE

## 2020-03-23 LAB — LACTIC ACID, PLASMA: Lactic Acid, Venous: 0.8 mmol/L (ref 0.5–1.9)

## 2020-03-23 LAB — D-DIMER, QUANTITATIVE: D-Dimer, Quant: 0.62 ug/mL-FEU — ABNORMAL HIGH (ref 0.00–0.50)

## 2020-03-23 MED ORDER — SODIUM CHLORIDE 0.9 % IV BOLUS
500.0000 mL | Freq: Once | INTRAVENOUS | Status: AC
  Administered 2020-03-23: 500 mL via INTRAVENOUS

## 2020-03-23 MED ORDER — ONDANSETRON 4 MG PO TBDP: 4.0000 mg | ORAL_TABLET | Freq: Three times a day (TID) | ORAL | 0 refills | Status: DC | PRN

## 2020-03-23 MED ORDER — ACETAMINOPHEN 325 MG PO TABS
650.0000 mg | ORAL_TABLET | Freq: Once | ORAL | Status: AC
  Administered 2020-03-23: 650 mg via ORAL
  Filled 2020-03-23: qty 2

## 2020-03-23 MED ORDER — ONDANSETRON HCL 4 MG/2ML IJ SOLN
4.0000 mg | Freq: Once | INTRAMUSCULAR | Status: AC
  Administered 2020-03-23: 4 mg via INTRAVENOUS
  Filled 2020-03-23: qty 2

## 2020-03-23 MED ORDER — IOHEXOL 350 MG/ML SOLN
70.0000 mL | Freq: Once | INTRAVENOUS | Status: AC | PRN
  Administered 2020-03-23: 50 mL via INTRAVENOUS

## 2020-03-23 MED ORDER — DOXYCYCLINE HYCLATE 100 MG PO CAPS
100.0000 mg | ORAL_CAPSULE | Freq: Two times a day (BID) | ORAL | 0 refills | Status: DC
Start: 2020-03-23 — End: 2020-09-11

## 2020-03-23 NOTE — ED Provider Notes (Signed)
07:00: Assumed care of patient from [place] PA- C at change of shift pending CTA of the chest & disposition.   Please see prior provider note for full H&P.  Briefly patient is a 34 yo male with a history of SDH & appendectomy who presented to the ED with concern for sweats & chills. Has had N/V as well.   Physical Exam  BP 131/83   Pulse 89   Temp 100 F (37.8 C) (Oral)   Resp 16   Ht '5\' 7"'$  (1.702 m)   Wt 94.8 kg   SpO2 99%   BMI 32.73 kg/m   Physical Exam Vitals and nursing note reviewed.  Constitutional:      General: He is not in acute distress.    Appearance: He is well-developed.  HENT:     Head: Normocephalic and atraumatic.  Eyes:     General:        Right eye: No discharge.        Left eye: No discharge.     Conjunctiva/sclera: Conjunctivae normal.  Pulmonary:     Effort: No tachypnea or respiratory distress.  Neurological:     Mental Status: He is alert.     Comments: Clear speech.   Psychiatric:        Behavior: Behavior normal.        Thought Content: Thought content normal.     ED Course/Procedures     Results for orders placed or performed during the hospital encounter of 03/22/20  SARS Coronavirus 2 by RT PCR (hospital order, performed in [facility] hospital lab) Nasopharyngeal Nasopharyngeal Swab   Specimen: Nasopharyngeal Swab  Result Value Ref Range   SARS Coronavirus 2 NEGATIVE NEGATIVE  Lipase, blood  Result Value Ref Range   Lipase 20 11 - 51 U/L  Comprehensive metabolic panel  Result Value Ref Range   Sodium 135 135 - 145 mmol/L   Potassium 4.0 3.5 - 5.1 mmol/L   Chloride 100 98 - 111 mmol/L   CO2 21 (L) 22 - 32 mmol/L   Glucose, Bld 104 (H) 70 - 99 mg/dL   BUN 7 6 - 20 mg/dL   Creatinine, Ser 1.28 (H) 0.61 - 1.24 mg/dL   Calcium 8.8 (L) 8.9 - 10.3 mg/dL   Total Protein 7.6 6.5 - 8.1 g/dL   Albumin 3.9 3.5 - 5.0 g/dL   AST 27 15 - 41 U/L   ALT 27 0 - 44 U/L   Alkaline Phosphatase 50 38 - 126 U/L   Total Bilirubin 0.6 0.3 -  1.2 mg/dL   GFR calc non Af Amer >60 >60 mL/min   GFR calc Af Amer >60 >60 mL/min   Anion gap 14 5 - 15  CBC  Result Value Ref Range   WBC 4.1 4.0 - 10.5 K/uL   RBC 5.36 4.22 - 5.81 MIL/uL   Hemoglobin 13.5 13.0 - 17.0 g/dL   HCT 41.9 39.0 - 52.0 %   MCV 78.2 (L) 80.0 - 100.0 fL   MCH 25.2 (L) 26.0 - 34.0 pg   MCHC 32.2 30.0 - 36.0 g/dL   RDW 14.3 11.5 - 15.5 %   Platelets 184 150 - 400 K/uL   nRBC 0.0 0.0 - 0.2 %  Urinalysis, Routine w reflex microscopic  Result Value Ref Range   Color, Urine STRAW (A) YELLOW   APPearance CLEAR CLEAR   Specific Gravity, Urine 1.002 (L) 1.005 - 1.030   pH 6.0 5.0 - 8.0   Glucose,  UA NEGATIVE NEGATIVE mg/dL   Hgb urine dipstick NEGATIVE NEGATIVE   Bilirubin Urine NEGATIVE NEGATIVE   Ketones, ur NEGATIVE NEGATIVE mg/dL   Protein, ur NEGATIVE NEGATIVE mg/dL   Nitrite NEGATIVE NEGATIVE   Leukocytes,Ua NEGATIVE NEGATIVE  Lactic acid, plasma  Result Value Ref Range   Lactic Acid, Venous 0.7 0.5 - 1.9 mmol/L  Lactic acid, plasma  Result Value Ref Range   Lactic Acid, Venous 0.8 0.5 - 1.9 mmol/L  D-dimer, quantitative (not at [facility])  Result Value Ref Range   D-Dimer, Quant 0.62 (H) 0.00 - 0.50 ug/mL-FEU  POC SARS Coronavirus 2 Ag-ED - Nasal Swab (BD Veritor Kit)  Result Value Ref Range   SARS Coronavirus 2 Ag NEGATIVE NEGATIVE   DG Chest 2 View  Result Date: 03/10/2020 CLINICAL DATA:  Chest pain EXAM: CHEST - 2 VIEW COMPARISON:  December 29, 2018 FINDINGS: The lungs are clear. The heart size and pulmonary vascularity are normal. No adenopathy. No pneumothorax. No bone lesions. IMPRESSION: No abnormality noted. Electronically Signed   By: Lowella Grip III M.D.   On: 03/10/2020 17:01   CT Angio Chest PE W and/or Wo Contrast  Result Date: 03/23/2020 CLINICAL DATA:  34 year old male with shortness of breath EXAM: CT ANGIOGRAPHY CHEST WITH CONTRAST TECHNIQUE: Multidetector CT imaging of the chest was performed using the standard protocol during  bolus administration of intravenous contrast. Multiplanar CT image reconstructions and MIPs were obtained to evaluate the vascular anatomy. CONTRAST:  69m OMNIPAQUE IOHEXOL 350 MG/ML SOLN COMPARISON:  Prior CT scan of the chest 12/29/2018 FINDINGS: Cardiovascular: Satisfactory opacification of the pulmonary arteries to the segmental level. No evidence of pulmonary embolism. Normal heart size. No pericardial effusion. Mediastinum/Nodes: No pathologically enlarged mediastinal, hilar, or axillary lymph nodes. Thyroid gland, trachea, and esophagus demonstrate no significant findings. Lungs/Pleura: Multifocal regions of patchy ground-glass attenuation airspace opacity throughout all lobes of both lungs slightly greater in the periphery and bilateral lower lobes. There is mild associated dependent atelectasis. No significant bronchial wall thickening. No pleural effusion, pulmonary edema or pneumothorax. Upper Abdomen: No acute abnormality. Musculoskeletal: No chest wall abnormality. No acute or significant osseous findings. Review of the MIP images confirms the above findings. IMPRESSION: 1. Multifocal patchy foci of ground-glass attenuation airspace opacity scattered throughout both lungs with a peripheral and lower lobe predominant distribution. Findings are most concerning for multifocal pneumonia including atypical and viral (COVID-19) pneumonia. 2. Negative for acute pulmonary embolus. Electronically Signed   By: HJacqulynn CadetM.D.   On: 03/23/2020 08:48   DG Chest Portable 1 View  Result Date: 03/23/2020 CLINICAL DATA:  Shortness of breath. Fever. EXAM: PORTABLE CHEST 1 VIEW COMPARISON:  Mar 10, 2020 FINDINGS: There are hazy multifocal airspace opacities bilaterally, most evident at the left lung base. There is no pneumothorax or pleural effusion. The heart size is normal. There is no acute osseous abnormality. IMPRESSION: Multifocal airspace opacities bilaterally concerning for multifocal pneumonia (viral  or bacterial). Electronically Signed   By: CConstance HolsterM.D.   On: 03/23/2020 03:48     Procedures  MDM   Work-up has been reviewed: CBC: No leukocytosis/leukopenia or significant anemia.  CMP: Mild elevation in creatinine- similar to prior ranges. Mild hypocalcemia, no significant electrolyte derangement.  UA: Unremarkable.  Lactic acid: WNL COVID rapid & PCR: Negative D-dimer: mildly elevated--> CTA ordered.  CXR: Multifocal airspace opacities bilaterally concerning for multifocal pneumonia (viral or bacterial).   Plan @ change of shift is to follow up on CTA- if negative  for PE will discharge home with doxycycline & supportive care.   --> CTA negative for PE, consistent with multifocal pneumonia. Ambulatory SpO2 97-100%. Overall appears appropriate for discharge home.  I discussed with the patient that even with his negative COVID-19 testing there is still concern for this and that he should quarantine accordingly.  We will discharge home with doxycycline and Zofran with recommendation for Tylenol as needed. I discussed results, treatment plan, need for follow-up, and return precautions with the patient. Provided opportunity for questions, patient confirmed understanding and is in agreement with plan.   Gabriel Fox was evaluated in Emergency Department on 03/23/2020 for the symptoms described in the history of present illness. He/she was evaluated in the context of the global COVID-19 pandemic, which necessitated consideration that the patient might be at risk for infection with the SARS-CoV-2 virus that causes COVID-19. Institutional protocols and algorithms that pertain to the evaluation of patients at risk for COVID-19 are in a state of rapid change based on information released by regulatory bodies including the CDC and federal and state organizations. These policies and algorithms were followed during the patient's care in the ED.         Amaryllis Dyke,  PA-C 03/23/20 5747    Sherwood Gambler, MD 03/23/20 1008

## 2020-03-23 NOTE — ED Notes (Signed)
Pt verbalized discharge instructions understanding and discharged from the ED in NAD

## 2020-03-23 NOTE — ED Provider Notes (Signed)
[facility] Provider Note   CSN: [id number] Arrival date & time: 03/22/20  1715     History Chief Complaint  Patient presents with  . Night Sweats  . Chills  . Emesis    [facility] is a 34 y.o. male with no significant past medical history who presents to the emergency department with a chief complaint of fever.  The patient reports that he has been having fever, chills, shortness of breath, cough, and pleuritic chest pain, and loss of sense of taste for the last 2 days.  He reports that he began vomiting yesterday and has had approximately 6-7 episodes of nonbloody, nonbilious vomiting and is feeling dehydrated.  He denies diarrhea, dysuria, back pain, abdominal pain, headache, dizziness, lightheadedness, leg swelling, orthopnea, numbness, weakness, neck pain or stiffness, or rash.  Per chart review, patient was seen on 5/5 for chest pain for the last 2 to 3 weeks.  He was also noted to be awakening in a cold sweats for the last few nights prior to ER evaluation.  He reportedly had previously traveled to [place] for vacation at that time.  D-dimer was negative during his visit.   He is a former smoker, quit 2 years ago.  Endorses occasional marijuana use.  Reports a family history of VTE.  He also reports that approximately 3 weeks ago that he flew to [place] for work.   [facility] was evaluated in Emergency Department on 03/23/2020 for the symptoms described in the history of present illness. He was evaluated in the context of the global COVID-19 pandemic, which necessitated consideration that the patient might be at risk for infection with the SARS-CoV-2 virus that causes COVID-19. Institutional protocols and algorithms that pertain to the evaluation of patients at risk for COVID-19 are in a state of rapid change based on information released by regulatory bodies including the CDC and federal and state organizations. These policies  and algorithms were followed during the patient's care in the ED.  The history is provided by the patient. No language interpreter was used.       No past medical history on file.  Patient Active Problem List   Diagnosis Date Noted  . Subdural hematoma (HCC) 05/24/2017  . SDH (subdural hematoma) (HCC) 05/24/2017    Past Surgical History:  Procedure Laterality Date  . APPENDECTOMY    . BURR HOLE Left 05/24/2017   Procedure: BURR HOLE Craniectomy;  Surgeon: Donalee Citrin, MD;  Location: [facility] OR;  Service: Neurosurgery;  Laterality: Left;       No family history on file.  Social History   Tobacco Use  . Smoking status: Never Smoker  . Smokeless tobacco: Never Used  . Tobacco comment: stress  Substance Use Topics  . Alcohol use: Not Currently    Comment: denies  . Drug use: Not Currently    Types: Marijuana    Comment: denies    Home Medications Prior to Admission medications   Medication Sig Start Date End Date Taking? Authorizing Provider  doxycycline (VIBRAMYCIN) 100 MG capsule Take 1 capsule (100 mg total) by mouth 2 (two) times daily. 03/23/20   Petrucelli, Samantha R, PA-C  ondansetron (ZOFRAN ODT) 4 MG disintegrating tablet Take 1 tablet (4 mg total) by mouth every 8 (eight) hours as needed for nausea or vomiting. 03/23/20   Petrucelli, Pleas Koch, PA-C    Allergies    Patient has no known allergies.  Review of Systems   Review  of Systems  Constitutional: Positive for chills and fever. Negative for appetite change.  HENT: Negative for congestion, sore throat and voice change.   Respiratory: Positive for cough and shortness of breath. Negative for wheezing.   Cardiovascular: Positive for chest pain. Negative for palpitations.  Gastrointestinal: Negative for abdominal pain, diarrhea, nausea and vomiting.  Genitourinary: Negative for dysuria.  Musculoskeletal: Negative for back pain, myalgias, neck pain and neck stiffness.  Skin: Negative for rash and wound.    Allergic/Immunologic: Negative for immunocompromised state.  Neurological: Negative for dizziness, seizures, weakness, numbness and headaches.  Psychiatric/Behavioral: Negative for confusion.    Physical Exam Updated Vital Signs BP 136/87 (BP Location: Right Arm)   Pulse 82   Temp 99.6 F (37.6 C) (Oral)   Resp 20   Ht 5\' 7"  (1.702 m)   Wt 94.8 kg   SpO2 100%   BMI 32.73 kg/m   Physical Exam Vitals and nursing note reviewed.  Constitutional:      General: He is not in acute distress.    Appearance: He is well-developed. He is not ill-appearing, toxic-appearing or diaphoretic.  HENT:     Head: Normocephalic.  Eyes:     Conjunctiva/sclera: Conjunctivae normal.  Neck:     Comments: No meningismus Cardiovascular:     Rate and Rhythm: Normal rate and regular rhythm.     Pulses: Normal pulses.     Heart sounds: Normal heart sounds. No murmur. No friction rub. No gallop.      Comments: Peripheral pulses are 2+ and symmetric. Pulmonary:     Effort: Pulmonary effort is normal. No respiratory distress.     Breath sounds: No stridor. No wheezing, rhonchi or rales.     Comments: Lungs are clear to auscultation bilaterally.  No increased work of breathing, retractions, accessory muscle use. Chest:     Chest wall: No tenderness.  Abdominal:     General: There is no distension.     Palpations: Abdomen is soft. There is no mass.     Tenderness: There is no abdominal tenderness. There is no right CVA tenderness, left CVA tenderness, guarding or rebound.     Hernia: No hernia is present.     Comments: Abdomen is soft, nontender, nondistended.  Musculoskeletal:     Cervical back: Neck supple.     Right lower leg: No edema.     Left lower leg: No edema.  Skin:    General: Skin is warm and dry.     Capillary Refill: Capillary refill takes less than 2 seconds.     Coloration: Skin is not jaundiced or pale.  Neurological:     Mental Status: He is alert.  Psychiatric:         Behavior: Behavior normal.     ED Results / Procedures / Treatments   Labs (all labs ordered are listed, but only abnormal results are displayed) Labs Reviewed  COMPREHENSIVE METABOLIC PANEL - Abnormal; Notable for the following components:      Result Value   CO2 21 (*)    Glucose, Bld 104 (*)    Creatinine, Ser 1.28 (*)    Calcium 8.8 (*)    All other components within normal limits  CBC - Abnormal; Notable for the following components:   MCV 78.2 (*)    MCH 25.2 (*)    All other components within normal limits  URINALYSIS, ROUTINE W REFLEX MICROSCOPIC - Abnormal; Notable for the following components:   Color, Urine STRAW (*)  Specific Gravity, Urine 1.002 (*)    All other components within normal limits  D-DIMER, QUANTITATIVE (NOT AT [facility]) - Abnormal; Notable for the following components:   D-Dimer, Quant 0.62 (*)    All other components within normal limits  SARS CORONAVIRUS 2 BY RT PCR (HOSPITAL ORDER, [facility])  LIPASE, BLOOD  LACTIC ACID, PLASMA  LACTIC ACID, PLASMA  POC SARS CORONAVIRUS 2 AG -  ED    EKG None  Radiology CT Angio Chest PE W and/or Wo Contrast  Result Date: 03/23/2020 CLINICAL DATA:  34 year old male with shortness of breath EXAM: CT ANGIOGRAPHY CHEST WITH CONTRAST TECHNIQUE: Multidetector CT imaging of the chest was performed using the standard protocol during bolus administration of intravenous contrast. Multiplanar CT image reconstructions and MIPs were obtained to evaluate the vascular anatomy. CONTRAST:  42mL OMNIPAQUE IOHEXOL 350 MG/ML SOLN COMPARISON:  Prior CT scan of the chest 12/29/2018 FINDINGS: Cardiovascular: Satisfactory opacification of the pulmonary arteries to the segmental level. No evidence of pulmonary embolism. Normal heart size. No pericardial effusion. Mediastinum/Nodes: No pathologically enlarged mediastinal, hilar, or axillary lymph nodes. Thyroid gland, trachea, and esophagus demonstrate no  significant findings. Lungs/Pleura: Multifocal regions of patchy ground-glass attenuation airspace opacity throughout all lobes of both lungs slightly greater in the periphery and bilateral lower lobes. There is mild associated dependent atelectasis. No significant bronchial wall thickening. No pleural effusion, pulmonary edema or pneumothorax. Upper Abdomen: No acute abnormality. Musculoskeletal: No chest wall abnormality. No acute or significant osseous findings. Review of the MIP images confirms the above findings. IMPRESSION: 1. Multifocal patchy foci of ground-glass attenuation airspace opacity scattered throughout both lungs with a peripheral and lower lobe predominant distribution. Findings are most concerning for multifocal pneumonia including atypical and viral (COVID-19) pneumonia. 2. Negative for acute pulmonary embolus. Electronically Signed   By: Jacqulynn Cadet M.D.   On: 03/23/2020 08:48   DG Chest Portable 1 View  Result Date: 03/23/2020 CLINICAL DATA:  Shortness of breath. Fever. EXAM: PORTABLE CHEST 1 VIEW COMPARISON:  Mar 10, 2020 FINDINGS: There are hazy multifocal airspace opacities bilaterally, most evident at the left lung base. There is no pneumothorax or pleural effusion. The heart size is normal. There is no acute osseous abnormality. IMPRESSION: Multifocal airspace opacities bilaterally concerning for multifocal pneumonia (viral or bacterial). Electronically Signed   By: Constance Holster M.D.   On: 03/23/2020 03:48    Procedures Procedures (including critical care time)  Medications Ordered in ED Medications  acetaminophen (TYLENOL) tablet 650 mg (650 mg Oral Given 03/22/20 1740)  sodium chloride flush (NS) 0.9 % injection 3 mL (3 mLs Intravenous Given 03/23/20 0432)  sodium chloride 0.9 % bolus 500 mL (0 mLs Intravenous Stopped 03/23/20 0642)  ondansetron (ZOFRAN) injection 4 mg (4 mg Intravenous Given 03/23/20 0339)  acetaminophen (TYLENOL) tablet 650 mg (650 mg Oral Given  03/23/20 0339)  iohexol (OMNIPAQUE) 350 MG/ML injection 70 mL (50 mLs Intravenous Contrast Given 03/23/20 2025)    ED Course  I have reviewed the triage vital signs and the nursing notes.  Pertinent labs & imaging results that were available during my care of the patient were reviewed by me and considered in my medical decision making (see chart for details).    MDM Rules/Calculators/A&P                      34 year old male with no significant past medical presenting with fever, chills, loss of sense of taste, cough, shortness  of breath, pleuritic chest pain for the last 2 days.  Febrile to 102 on arrival that resolved with Tylenol.  Vital signs are otherwise unremarkable.  Chest x-ray has been reviewed by me with multifocal airspace opacities bilaterally concerning for viral or bacterial multifocal pneumonia.  Given his symptoms, there is a strong suspicion for COVID-19.  There is no leukocytosis.  Creatinine is 1.28.  Labs are otherwise reassuring.  The patient was discussed with Dr. Elesa Massed, attending physician.  Rapid COVID-19 test was ordered, but was negative.  Will order 2-hour COVID-19 test.  Patient does also present with concerns for pleuritic chest pain.  Patient does report recent flight to [place] about 3 weeks ago and ER note from 5/5 indicates that the patient recently flew to [place]  He also endorses a family history of VTE.  Given strong suspicion for COVID-19 in the setting of other risk factors for PE, D-dimer was obtained as D-dimer from previous ER visit was negative.  D-dimer is minimally elevated at 0.63.  Will order CT PE study.  COVID-19 PCR test is negative.  However, I suspect that this is a false negative.  Patient was ambulated in the department and did not have any hypoxia or increased work of breathing.  He has been successfully fluid challenge.  If CT demonstrates a PE, will discharge the patient home with anticoagulation.  If not, patient can still be  discharged home with symptomatic treatment as well as antibiotics for bacterial pneumonia given that COVID-19 test was negative.  Patient care transferred to PA Petrucelli at the end of my shift. Patient presentation, ED course, and plan of care discussed with review of all pertinent labs and imaging. Please see his/her note for further details regarding further ED course and disposition.  Final Clinical Impression(s) / ED Diagnoses Final diagnoses:  Multifocal pneumonia    Rx / DC Orders ED Discharge Orders         Ordered    doxycycline (VIBRAMYCIN) 100 MG capsule  2 times daily     03/23/20 0911    ondansetron (ZOFRAN ODT) 4 MG disintegrating tablet  Every 8 hours PRN     03/23/20 0911           Shenicka Sunderlin A, PA-C 03/23/20 1139    Ward, Layla Maw, DO 03/24/20 0244

## 2020-03-23 NOTE — Discharge Instructions (Addendum)
You were seen in the emergency department today for fever and chills.  Your chest x-ray showed that you have pneumonia.  Your COVID-19 testing was negative, however based on your symptoms we are concerned this still may be a possibility.  We are covering you for bacterial pneumonia with doxycycline, this is an antibiotic, please take this as prescribed.  Be sure to take this with food as it can cause stomach upset.  Also send you home with Zofran to take every 8 hours as needed for nausea and vomiting.  Please take Tylenol per over-the-counter dosing to help with fevers and discomfort.  We have prescribed you new medication(s) today. Discuss the medications prescribed today with your pharmacist as they can have adverse effects and interactions with your other medicines including over the counter and prescribed medications. Seek medical evaluation if you start to experience new or abnormal symptoms after taking one of these medicines, seek care immediately if you start to experience difficulty breathing, feeling of your throat closing, facial swelling, or rash as these could be indications of a more serious allergic reaction  We are instructing patient's with COVID 19 or symptoms of COVID 19 to quarantine themselves for 14 days. You may be able to discontinue self quarantine if the following conditions are met:   Persons with COVID-19 who have symptoms and were directed to care for themselves at home may discontinue home isolation under the  following conditions: - It has been at least 7 days have passed since symptoms first appeared. - AND at least 3 days (72 hours) have passed since recovery defined as resolution of fever without the use of fever-reducing medications and improvement in respiratory symptoms (e.g., cough, shortness of breath)  Please follow the below quarantine instructions.  Please follow up with primary care within 3-5 days for re-evaluation- call prior to going to the office to make  them aware of your symptoms as some offices are altering their method of seeing patients with COVID 19 symptoms. Return to the ER for new or worsening symptoms including but not limited to increased work of breathing, chest pain, passing out, inability to keep fluids down, coughing up blood, or any other concerns.       Person Under Monitoring Name: [facility]  Location: [address] [place] [address]   Infection Prevention Recommendations for Individuals Confirmed to have, or Being Evaluated for, 2019 Novel Coronavirus (COVID-19) Infection Who Receive Care at Home  Individuals who are confirmed to have, or are being evaluated for, COVID-19 should follow the prevention steps below until a healthcare provider or local or state health department says they can return to normal activities.  Stay home except to get medical care You should restrict activities outside your home, except for getting medical care. Do not go to work, school, or public areas, and do not use public transportation or taxis.  Call ahead before visiting your doctor Before your medical appointment, call the healthcare provider and tell them that you have, or are being evaluated for, COVID-19 infection. This will help the healthcare provider's office take steps to keep other people from getting infected. Ask your healthcare provider to call the local or state health department.  Monitor your symptoms Seek prompt medical attention if your illness is worsening (e.g., difficulty breathing). Before going to your medical appointment, call the healthcare provider and tell them that you have, or are being evaluated for, COVID-19 infection. Ask your healthcare provider to call the local or state health  department.  Wear a facemask You should wear a facemask that covers your nose and mouth when you are in the same room with other people and when you visit a healthcare provider. People who live with or visit you  should also wear a facemask while they are in the same room with you.  Separate yourself from other people in your home As much as possible, you should stay in a different room from other people in your home. Also, you should use a separate bathroom, if available.  Avoid sharing household items You should not share dishes, drinking glasses, cups, eating utensils, towels, bedding, or other items with other people in your home. After using these items, you should wash them thoroughly with soap and water.  Cover your coughs and sneezes Cover your mouth and nose with a tissue when you cough or sneeze, or you can cough or sneeze into your sleeve. Throw used tissues in a lined trash can, and immediately wash your hands with soap and water for at least 20 seconds or use an alcohol-based hand rub.  Wash your [facility] your hands often and thoroughly with soap and water for at least 20 seconds. You can use an alcohol-based hand sanitizer if soap and water are not available and if your hands are not visibly dirty. Avoid touching your eyes, nose, and mouth with unwashed hands.   Prevention Steps for Caregivers and Household Members of Individuals Confirmed to have, or Being Evaluated for, COVID-19 Infection Being Cared for in the Home  If you live with, or provide care at home for, a person confirmed to have, or being evaluated for, COVID-19 infection please follow these guidelines to prevent infection:  Follow healthcare provider's instructions Make sure that you understand and can help the patient follow any healthcare provider instructions for all care.  Provide for the patient's basic needs You should help the patient with basic needs in the home and provide support for getting groceries, prescriptions, and other personal needs.  Monitor the patient's symptoms If they are getting sicker, call his or her medical provider and tell them that the patient has, or is being evaluated  for, COVID-19 infection. This will help the healthcare provider's office take steps to keep other people from getting infected. Ask the healthcare provider to call the local or state health department.  Limit the number of people who have contact with the patient If possible, have only one caregiver for the patient. Other household members should stay in another home or place of residence. If this is not possible, they should stay in another room, or be separated from the patient as much as possible. Use a separate bathroom, if available. Restrict visitors who do not have an essential need to be in the home.  Keep older adults, very young children, and other sick people away from the patient Keep older adults, very young children, and those who have compromised immune systems or chronic health conditions away from the patient. This includes people with chronic heart, lung, or kidney conditions, diabetes, and cancer.  Ensure good ventilation Make sure that shared spaces in the home have good air flow, such as from an air conditioner or an opened window, weather permitting.  Wash your hands often Wash your hands often and thoroughly with soap and water for at least 20 seconds. You can use an alcohol based hand sanitizer if soap and water are not available and if your hands are not visibly dirty. Avoid touching your eyes,  nose, and mouth with unwashed hands. Use disposable paper towels to dry your hands. If not available, use dedicated cloth towels and replace them when they become wet.  Wear a facemask and gloves Wear a disposable facemask at all times in the room and gloves when you touch or have contact with the patient's blood, body fluids, and/or secretions or excretions, such as sweat, saliva, sputum, nasal mucus, vomit, urine, or feces.  Ensure the mask fits over your nose and mouth tightly, and do not touch it during use. Throw out disposable facemasks and gloves after using them. Do not  reuse. Wash your hands immediately after removing your facemask and gloves. If your personal clothing becomes contaminated, carefully remove clothing and launder. Wash your hands after handling contaminated clothing. Place all used disposable facemasks, gloves, and other waste in a lined container before disposing them with other household waste. Remove gloves and wash your hands immediately after handling these items.  Do not share dishes, glasses, or other household items with the patient Avoid sharing household items. You should not share dishes, drinking glasses, cups, eating utensils, towels, bedding, or other items with a patient who is confirmed to have, or being evaluated for, COVID-19 infection. After the person uses these items, you should wash them thoroughly with soap and water.  Wash laundry thoroughly Immediately remove and wash clothes or bedding that have blood, body fluids, and/or secretions or excretions, such as sweat, saliva, sputum, nasal mucus, vomit, urine, or feces, on them. Wear gloves when handling laundry from the patient. Read and follow directions on labels of laundry or clothing items and detergent. In general, wash and dry with the warmest temperatures recommended on the label.  Clean all areas the individual has used often Clean all touchable surfaces, such as counters, tabletops, doorknobs, bathroom fixtures, toilets, phones, keyboards, tablets, and bedside tables, every day. Also, clean any surfaces that may have blood, body fluids, and/or secretions or excretions on them. Wear gloves when cleaning surfaces the patient has come in contact with. Use a diluted bleach solution (e.g., dilute bleach with 1 part bleach and 10 parts water) or a household disinfectant with a label that says EPA-registered for coronaviruses. To make a bleach solution at home, add 1 tablespoon of bleach to 1 quart (4 cups) of water. For a larger supply, add  cup of bleach to 1 gallon (16  cups) of water. Read labels of cleaning products and follow recommendations provided on product labels. Labels contain instructions for safe and effective use of the cleaning product including precautions you should take when applying the product, such as wearing gloves or eye protection and making sure you have good ventilation during use of the product. Remove gloves and wash hands immediately after cleaning.  Monitor yourself for signs and symptoms of illness Caregivers and household members are considered close contacts, should monitor their health, and will be asked to limit movement outside of the home to the extent possible. Follow the monitoring steps for close contacts listed on the symptom monitoring form.   ? If you have additional questions, contact your local health department or call the epidemiologist on call at [phone] (available 24/7). ? This guidance is subject to change. For the most up-to-date guidance from [facility], please refer to their website: YouBlogs.pl

## 2020-03-23 NOTE — ED Notes (Addendum)
O2 97-100% while ambulating

## 2020-06-29 ENCOUNTER — Emergency Department (HOSPITAL_COMMUNITY)
Admission: EM | Admit: 2020-06-29 | Discharge: 2020-06-29 | Disposition: A | Discharge status: Home / Self Care | Payer: Self-pay | Attending: Emergency Medicine | Admitting: Emergency Medicine

## 2020-06-29 ENCOUNTER — Other Ambulatory Visit: Payer: Self-pay

## 2020-06-29 ENCOUNTER — Encounter (HOSPITAL_COMMUNITY): Payer: Self-pay | Admitting: *Deleted

## 2020-06-29 ENCOUNTER — Emergency Department (HOSPITAL_COMMUNITY): Payer: Self-pay

## 2020-06-29 DIAGNOSIS — S61532A Puncture wound without foreign body of left wrist, initial encounter: Secondary | ICD-10-CM | POA: Insufficient documentation

## 2020-06-29 DIAGNOSIS — S1191XA Laceration without foreign body of unspecified part of neck, initial encounter: Secondary | ICD-10-CM

## 2020-06-29 DIAGNOSIS — S61512A Laceration without foreign body of left wrist, initial encounter: Secondary | ICD-10-CM

## 2020-06-29 DIAGNOSIS — Y999 Unspecified external cause status: Secondary | ICD-10-CM | POA: Insufficient documentation

## 2020-06-29 DIAGNOSIS — Y939 Activity, unspecified: Secondary | ICD-10-CM | POA: Insufficient documentation

## 2020-06-29 DIAGNOSIS — X58XXXA Exposure to other specified factors, initial encounter: Secondary | ICD-10-CM | POA: Insufficient documentation

## 2020-06-29 DIAGNOSIS — T148XXA Other injury of unspecified body region, initial encounter: Secondary | ICD-10-CM

## 2020-06-29 DIAGNOSIS — S1193XA Puncture wound without foreign body of unspecified part of neck, initial encounter: Secondary | ICD-10-CM | POA: Insufficient documentation

## 2020-06-29 DIAGNOSIS — Y929 Unspecified place or not applicable: Secondary | ICD-10-CM | POA: Insufficient documentation

## 2020-06-29 DIAGNOSIS — Z20822 Contact with and (suspected) exposure to covid-19: Secondary | ICD-10-CM | POA: Insufficient documentation

## 2020-06-29 LAB — SARS CORONAVIRUS 2 BY RT PCR (HOSPITAL ORDER, PERFORMED IN ~~LOC~~ HOSPITAL LAB): SARS Coronavirus 2: NEGATIVE

## 2020-06-29 LAB — TRAUMA TEG PANEL
CFF Max Amplitude: 17.9 mm (ref 15–32)
Citrated Kaolin (R): 5.4 min (ref 4.6–9.1)
Citrated Rapid TEG (MA): 59.9 mm (ref 52–70)
Lysis at 30 Minutes: 0.7 % (ref 0.0–2.6)

## 2020-06-29 LAB — COMPREHENSIVE METABOLIC PANEL
ALT: 27 U/L (ref 0–44)
AST: 32 U/L (ref 15–41)
Albumin: 4.5 g/dL (ref 3.5–5.0)
Alkaline Phosphatase: 56 U/L (ref 38–126)
Anion gap: 22 — ABNORMAL HIGH (ref 5–15)
BUN: 15 mg/dL (ref 6–20)
CO2: 14 mmol/L — ABNORMAL LOW (ref 22–32)
Calcium: 9.3 mg/dL (ref 8.9–10.3)
Chloride: 104 mmol/L (ref 98–111)
Creatinine, Ser: 1.49 mg/dL — ABNORMAL HIGH (ref 0.61–1.24)
GFR calc Af Amer: >60 mL/min (ref >60)
GFR calc non Af Amer: >60 mL/min (ref >60)
Glucose, Bld: 123 mg/dL — ABNORMAL HIGH (ref 70–99)
Potassium: 3.8 mmol/L (ref 3.5–5.1)
Sodium: 140 mmol/L (ref 135–145)
Total Bilirubin: 0.4 mg/dL (ref 0.3–1.2)
Total Protein: 7.8 g/dL (ref 6.5–8.1)

## 2020-06-29 LAB — PROTIME-INR
INR: 1 (ref 0.8–1.2)
Prothrombin Time: 12.5 seconds (ref 11.4–15.2)

## 2020-06-29 LAB — I-STAT CHEM 8, ED
BUN: 17 mg/dL (ref 6–20)
Calcium, Ion: 1.1 mmol/L — ABNORMAL LOW (ref 1.15–1.40)
Chloride: 107 mmol/L (ref 98–111)
Creatinine, Ser: 1.3 mg/dL — ABNORMAL HIGH (ref 0.61–1.24)
Glucose, Bld: 118 mg/dL — ABNORMAL HIGH (ref 70–99)
HCT: 43 % (ref 39.0–52.0)
Hemoglobin: 14.6 g/dL (ref 13.0–17.0)
Potassium: 3.6 mmol/L (ref 3.5–5.1)
Sodium: 141 mmol/L (ref 135–145)
TCO2: 15 mmol/L — ABNORMAL LOW (ref 22–32)

## 2020-06-29 LAB — SAMPLE TO BLOOD BANK

## 2020-06-29 LAB — LACTIC ACID, PLASMA: Lactic Acid, Venous: >11 mmol/L (ref 0.5–1.9)

## 2020-06-29 LAB — ETHANOL: Alcohol, Ethyl (B): <10 mg/dL (ref <10)

## 2020-06-29 MED ORDER — LACTATED RINGERS IV BOLUS
1000.0000 mL | Freq: Once | INTRAVENOUS | Status: AC
  Administered 2020-06-29: 1000 mL via INTRAVENOUS

## 2020-06-29 MED ORDER — MIDAZOLAM HCL 2 MG/2ML IJ SOLN
INTRAMUSCULAR | Status: AC
  Administered 2020-06-29: 0.5 mg
  Filled 2020-06-29: qty 2

## 2020-06-29 MED ORDER — ONDANSETRON HCL 4 MG/2ML IJ SOLN
4.0000 mg | Freq: Once | INTRAMUSCULAR | Status: AC
  Administered 2020-06-29: 4 mg via INTRAVENOUS
  Filled 2020-06-29: qty 2

## 2020-06-29 MED ORDER — IOHEXOL 300 MG/ML  SOLN
75.0000 mL | Freq: Once | INTRAMUSCULAR | Status: AC | PRN
  Administered 2020-06-29: 75 mL via INTRAVENOUS

## 2020-06-29 MED ORDER — IOHEXOL 350 MG/ML SOLN
80.0000 mL | Freq: Once | INTRAVENOUS | Status: AC | PRN
  Administered 2020-06-29: 80 mL via INTRAVENOUS
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
## 2020-06-29 NOTE — ED Notes (Signed)
Transported to CT scan

## 2020-06-29 NOTE — ED Notes (Signed)
Pt taken to CT with primary RN  

## 2020-06-29 NOTE — Progress Notes (Signed)
Chaplain responded to the Level I.  Patient was evaluated and went to CT.  Chaplain connected with the patient who had called his family and stated he was doing ok.  According to Med staff patient will be released.  Chaplain available as needed for support. Chaplain Agustin Cree, MDiv.     06/29/20 0225  Clinical Encounter Type  Visited With Patient;Health care provider  Visit Type Trauma  Referral From Nurse  Consult/Referral To Chaplain  Stress Factors  Family Stress Factors None identified
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
## 2020-06-29 NOTE — ED Notes (Signed)
EDP notified on elevated Lactic Acid result.  

## 2020-09-11 ENCOUNTER — Ambulatory Visit (HOSPITAL_COMMUNITY)
Admission: EM | Admit: 2020-09-11 | Discharge: 2020-09-11 | Disposition: A | Discharge status: Home / Self Care | Payer: Self-pay | Attending: Urgent Care | Admitting: Urgent Care

## 2020-09-11 ENCOUNTER — Encounter (HOSPITAL_COMMUNITY): Payer: Self-pay

## 2020-09-11 ENCOUNTER — Other Ambulatory Visit: Payer: Self-pay

## 2020-09-11 DIAGNOSIS — R369 Urethral discharge, unspecified: Secondary | ICD-10-CM

## 2020-09-11 DIAGNOSIS — M545 Low back pain, unspecified: Secondary | ICD-10-CM | POA: Insufficient documentation

## 2020-09-11 DIAGNOSIS — Z7251 High risk heterosexual behavior: Secondary | ICD-10-CM | POA: Insufficient documentation

## 2020-09-11 MED ORDER — CEFTRIAXONE SODIUM 500 MG IJ SOLR
INTRAMUSCULAR | Status: AC
  Filled 2020-09-11: qty 500

## 2020-09-11 MED ORDER — NAPROXEN 500 MG PO TABS: 500.0000 mg | ORAL_TABLET | Freq: Two times a day (BID) | ORAL | 0 refills | Status: DC

## 2020-09-11 MED ORDER — LIDOCAINE HCL (PF) 1 % IJ SOLN
INTRAMUSCULAR | Status: AC
  Filled 2020-09-11: qty 2

## 2020-09-11 MED ORDER — DOXYCYCLINE HYCLATE 100 MG PO CAPS: 100.0000 mg | ORAL_CAPSULE | Freq: Two times a day (BID) | ORAL | 0 refills | Status: DC

## 2020-09-11 MED ORDER — CEFTRIAXONE SODIUM 500 MG IJ SOLR
500.0000 mg | Freq: Once | INTRAMUSCULAR | Status: AC
  Administered 2020-09-11: 500 mg via INTRAMUSCULAR

## 2020-09-11 MED ORDER — TIZANIDINE HCL 4 MG PO TABS: 4.0000 mg | ORAL_TABLET | Freq: Three times a day (TID) | ORAL | 0 refills | Status: DC | PRN

## 2020-09-11 NOTE — Discharge Instructions (Signed)
Avoid all forms of sexual intercourse (oral, vaginal, anal) for the next 7 days to avoid spreading/reinfecting. Return if symptoms worsen/do not resolve, you develop fever, abdominal pain, blood in your urine, or are re-exposed to an STI.  

## 2020-09-11 NOTE — ED Triage Notes (Signed)
Pt presents with penile discharge upon waking up this morning and back pain.

## 2020-09-11 NOTE — ED Provider Notes (Signed)
[facility]   MRN: [id number] DOB: May 05, 1986  Subjective:   LIAHM GRIVAS is a 34 y.o. male presenting for 1 day history of acute onset penile discharge. He also woke up with left-sided low back pain today. Patient is sexually active with 2 male partners. Does not always use condoms for protection. Has already had an infection with gonorrhea and chlamydia earlier this year. Denies fever, nausea, vomiting, belly pain, pelvic pain, dysuria, hematuria, radicular symptoms, weakness, falls, trauma.  Denies taking chronic medications.  No Known Allergies  No past medical history on file.   Past Surgical History:  Procedure Laterality Date  . APPENDECTOMY    . BURR HOLE Left 05/24/2017   Procedure: BURR HOLE Craniectomy;  Surgeon: Donalee Citrin, MD;  Location: [facility] OR;  Service: Neurosurgery;  Laterality: Left;    No family history on file.  Social History   Tobacco Use  . Smoking status: Never Smoker  . Smokeless tobacco: Never Used  . Tobacco comment: stress  Vaping Use  . Vaping Use: Never used  Substance Use Topics  . Alcohol use: Not Currently    Comment: denies  . Drug use: Not Currently    Types: Marijuana    Comment: denies    ROS   Objective:   Vitals: BP 140/83 (BP Location: Left Arm)   Pulse 78   Temp 98.1 F (36.7 C) (Oral)   Resp 18   SpO2 99%   Physical Exam Constitutional:      General: He is not in acute distress.    Appearance: Normal appearance. He is well-developed and normal weight. He is not ill-appearing, toxic-appearing or diaphoretic.  HENT:     Head: Normocephalic and atraumatic.     Right Ear: External ear normal.     Left Ear: External ear normal.     Nose: Nose normal.     Mouth/Throat:     Pharynx: Oropharynx is clear.  Eyes:     General: No scleral icterus.       Right eye: No discharge.        Left eye: No discharge.     Extraocular Movements: Extraocular movements intact.     Pupils: Pupils are equal,  round, and reactive to light.  Cardiovascular:     Rate and Rhythm: Normal rate.  Pulmonary:     Effort: Pulmonary effort is normal.  Genitourinary:    Penis: Discharge (As seen on the swab for cytology) present.   Musculoskeletal:     Cervical back: Normal range of motion.     Lumbar back: Spasms and tenderness (Over area outlined) present. No swelling, edema, deformity, signs of trauma, lacerations or bony tenderness. Normal range of motion. Negative right straight leg raise test and negative left straight leg raise test. No scoliosis.       Back:  Skin:    General: Skin is warm and dry.  Neurological:     Mental Status: He is alert and oriented to person, place, and time.     Motor: No weakness.     Coordination: Coordination normal.     Gait: Gait normal.     Deep Tendon Reflexes: Reflexes normal.  Psychiatric:        Mood and Affect: Mood normal.        Behavior: Behavior normal.        Thought Content: Thought content normal.        Judgment: Judgment normal.     Assessment and  Plan :   PDMP not reviewed this encounter.  1. Penile discharge   2. Unprotected sex   3. Acute left-sided low back pain without sciatica     Patient treated empirically as per CDC guidelines with IM ceftriaxone, doxycycline as an outpatient.  Labs pending.   Counseled on safe sex practices including abstaining for 1 week following treatment. Recommended conservative management for his back pain including naproxen, tizanidine rest and hydration. Counseled patient on potential for adverse effects with medications prescribed/recommended today, ER and return-to-clinic precautions discussed, patient verbalized understanding.    Wallis Bamberg, PA-C 09/11/20 1148

## 2020-09-13 LAB — CYTOLOGY, (ORAL, ANAL, URETHRAL) ANCILLARY ONLY
Chlamydia: NEGATIVE
Comment: NEGATIVE
Comment: NEGATIVE
Comment: NORMAL
Neisseria Gonorrhea: POSITIVE — AB
Trichomonas: NEGATIVE

## 2020-10-22 ENCOUNTER — Other Ambulatory Visit: Payer: Self-pay

## 2020-10-22 ENCOUNTER — Inpatient Hospital Stay (HOSPITAL_COMMUNITY): Payer: Self-pay | Admitting: Anesthesiology

## 2020-10-22 ENCOUNTER — Encounter (HOSPITAL_COMMUNITY)
Admission: EM | Disposition: A | Discharge status: Home / Self Care | Payer: Self-pay | Source: Home / Self Care | Attending: Orthopedic Surgery

## 2020-10-22 ENCOUNTER — Encounter (HOSPITAL_COMMUNITY): Payer: Self-pay | Admitting: Emergency Medicine

## 2020-10-22 ENCOUNTER — Inpatient Hospital Stay (HOSPITAL_COMMUNITY)
Admission: EM | Admit: 2020-10-22 | Discharge: 2020-10-27 | DRG: 488 | Disposition: A | Discharge status: Home / Self Care | Payer: Self-pay | Attending: Student | Admitting: Student

## 2020-10-22 ENCOUNTER — Inpatient Hospital Stay (HOSPITAL_COMMUNITY): Payer: Self-pay

## 2020-10-22 ENCOUNTER — Emergency Department (HOSPITAL_COMMUNITY): Payer: Self-pay

## 2020-10-22 DIAGNOSIS — T148XXA Other injury of unspecified body region, initial encounter: Secondary | ICD-10-CM

## 2020-10-22 DIAGNOSIS — D62 Acute posthemorrhagic anemia: Secondary | ICD-10-CM | POA: Diagnosis not present

## 2020-10-22 DIAGNOSIS — F17210 Nicotine dependence, cigarettes, uncomplicated: Secondary | ICD-10-CM | POA: Diagnosis present

## 2020-10-22 DIAGNOSIS — Z419 Encounter for procedure for purposes other than remedying health state, unspecified: Secondary | ICD-10-CM

## 2020-10-22 DIAGNOSIS — Y9289 Other specified places as the place of occurrence of the external cause: Secondary | ICD-10-CM

## 2020-10-22 DIAGNOSIS — Z79899 Other long term (current) drug therapy: Secondary | ICD-10-CM

## 2020-10-22 DIAGNOSIS — S83282A Other tear of lateral meniscus, current injury, left knee, initial encounter: Secondary | ICD-10-CM

## 2020-10-22 DIAGNOSIS — T1490XA Injury, unspecified, initial encounter: Secondary | ICD-10-CM

## 2020-10-22 DIAGNOSIS — S82142A Displaced bicondylar fracture of left tibia, initial encounter for closed fracture: Principal | ICD-10-CM | POA: Diagnosis present

## 2020-10-22 DIAGNOSIS — Z20822 Contact with and (suspected) exposure to covid-19: Secondary | ICD-10-CM | POA: Diagnosis present

## 2020-10-22 DIAGNOSIS — S83262A Peripheral tear of lateral meniscus, current injury, left knee, initial encounter: Secondary | ICD-10-CM | POA: Diagnosis present

## 2020-10-22 LAB — TYPE AND SCREEN
ABO/RH(D): O POS
Antibody Screen: NEGATIVE

## 2020-10-22 LAB — BASIC METABOLIC PANEL
Anion gap: 10 (ref 5–15)
BUN: 10 mg/dL (ref 6–20)
CO2: 23 mmol/L (ref 22–32)
Calcium: 9.2 mg/dL (ref 8.9–10.3)
Chloride: 105 mmol/L (ref 98–111)
Creatinine, Ser: 1.13 mg/dL (ref 0.61–1.24)
GFR, Estimated: >60 mL/min (ref >60)
Glucose, Bld: 112 mg/dL — ABNORMAL HIGH (ref 70–99)
Potassium: 4.1 mmol/L (ref 3.5–5.1)
Sodium: 138 mmol/L (ref 135–145)

## 2020-10-22 LAB — RESP PANEL BY RT-PCR (FLU A&B, COVID) ARPGX2
Influenza A by PCR: NEGATIVE
Influenza B by PCR: NEGATIVE
SARS Coronavirus 2 by RT PCR: NEGATIVE

## 2020-10-22 SURGERY — EXTERNAL FIXATION, LOWER EXTREMITY
Anesthesia: General | Site: Knee | Laterality: Left

## 2020-10-22 MED ORDER — HYDROMORPHONE HCL 1 MG/ML IJ SOLN
INTRAMUSCULAR | Status: AC
  Filled 2020-10-22: qty 1

## 2020-10-22 MED ORDER — AMISULPRIDE (ANTIEMETIC) 5 MG/2ML IV SOLN: 10.0000 mg | Freq: Once | INTRAVENOUS | Status: DC | PRN

## 2020-10-22 MED ORDER — ONDANSETRON HCL 4 MG PO TABS: 4.0000 mg | ORAL_TABLET | Freq: Four times a day (QID) | ORAL | Status: DC | PRN

## 2020-10-22 MED ORDER — HYDROMORPHONE HCL 1 MG/ML IJ SOLN
0.2500 mg | INTRAMUSCULAR | Status: DC | PRN
  Administered 2020-10-22 (×2): 0.5 mg via INTRAVENOUS

## 2020-10-22 MED ORDER — LIDOCAINE 2% (20 MG/ML) 5 ML SYRINGE
INTRAMUSCULAR | Status: DC | PRN
  Administered 2020-10-22: 50 mg via INTRAVENOUS

## 2020-10-22 MED ORDER — LACTATED RINGERS IV SOLN: INTRAVENOUS | Status: DC | PRN

## 2020-10-22 MED ORDER — HYDROMORPHONE HCL 1 MG/ML IJ SOLN
1.0000 mg | Freq: Once | INTRAMUSCULAR | Status: AC
  Administered 2020-10-22: 1 mg via INTRAVENOUS
  Filled 2020-10-22: qty 1

## 2020-10-22 MED ORDER — MIDAZOLAM HCL 5 MG/5ML IJ SOLN
INTRAMUSCULAR | Status: DC | PRN
  Administered 2020-10-22: 2 mg via INTRAVENOUS

## 2020-10-22 MED ORDER — HYDROCODONE-ACETAMINOPHEN 5-325 MG PO TABS
1.0000 | ORAL_TABLET | ORAL | Status: DC | PRN
  Administered 2020-10-22: 2 via ORAL
  Filled 2020-10-22: qty 2

## 2020-10-22 MED ORDER — METHOCARBAMOL 1000 MG/10ML IJ SOLN
500.0000 mg | Freq: Four times a day (QID) | INTRAVENOUS | Status: DC | PRN
  Filled 2020-10-22 (×3): qty 5

## 2020-10-22 MED ORDER — ACETAMINOPHEN 325 MG PO TABS
325.0000 mg | ORAL_TABLET | Freq: Four times a day (QID) | ORAL | Status: DC | PRN
  Administered 2020-10-24: 650 mg via ORAL
  Filled 2020-10-22: qty 2

## 2020-10-22 MED ORDER — DEXAMETHASONE SODIUM PHOSPHATE 10 MG/ML IJ SOLN
INTRAMUSCULAR | Status: DC | PRN
  Administered 2020-10-22: 10 mg via INTRAVENOUS

## 2020-10-22 MED ORDER — MORPHINE SULFATE (PF) 2 MG/ML IV SOLN
0.5000 mg | INTRAVENOUS | Status: DC | PRN
  Administered 2020-10-23: 0.5 mg via INTRAVENOUS
  Administered 2020-10-23 – 2020-10-25 (×2): 1 mg via INTRAVENOUS
  Filled 2020-10-22 (×3): qty 1

## 2020-10-22 MED ORDER — ENOXAPARIN SODIUM 40 MG/0.4ML ~~LOC~~ SOLN
40.0000 mg | Freq: Every day | SUBCUTANEOUS | Status: DC
  Administered 2020-10-23 – 2020-10-27 (×4): 40 mg via SUBCUTANEOUS
  Filled 2020-10-22 (×4): qty 0.4

## 2020-10-22 MED ORDER — CEFAZOLIN SODIUM-DEXTROSE 2-4 GM/100ML-% IV SOLN
INTRAVENOUS | Status: AC
  Filled 2020-10-22: qty 100

## 2020-10-22 MED ORDER — ONDANSETRON HCL 4 MG/2ML IJ SOLN: 4.0000 mg | Freq: Four times a day (QID) | INTRAMUSCULAR | Status: DC | PRN

## 2020-10-22 MED ORDER — MIDAZOLAM HCL 2 MG/2ML IJ SOLN
INTRAMUSCULAR | Status: AC
  Filled 2020-10-22: qty 2

## 2020-10-22 MED ORDER — ACETAMINOPHEN 10 MG/ML IV SOLN
INTRAVENOUS | Status: AC
  Filled 2020-10-22: qty 100

## 2020-10-22 MED ORDER — SODIUM CHLORIDE 0.9 % IR SOLN
Status: DC | PRN
  Administered 2020-10-22: 1000 mL

## 2020-10-22 MED ORDER — DOCUSATE SODIUM 100 MG PO CAPS
100.0000 mg | ORAL_CAPSULE | Freq: Two times a day (BID) | ORAL | Status: DC
  Administered 2020-10-23 – 2020-10-27 (×8): 100 mg via ORAL
  Filled 2020-10-22 (×8): qty 1

## 2020-10-22 MED ORDER — ACETAMINOPHEN 10 MG/ML IV SOLN
1000.0000 mg | Freq: Once | INTRAVENOUS | Status: DC | PRN
Start: 2020-10-22 — End: 2020-10-22
  Administered 2020-10-22: 1000 mg via INTRAVENOUS

## 2020-10-22 MED ORDER — METHOCARBAMOL 500 MG PO TABS
500.0000 mg | ORAL_TABLET | Freq: Four times a day (QID) | ORAL | Status: DC | PRN
  Administered 2020-10-22 – 2020-10-27 (×10): 500 mg via ORAL
  Filled 2020-10-22 (×10): qty 1

## 2020-10-22 MED ORDER — FENTANYL CITRATE (PF) 100 MCG/2ML IJ SOLN
INTRAMUSCULAR | Status: DC | PRN
  Administered 2020-10-22: 125 ug via INTRAVENOUS

## 2020-10-22 MED ORDER — LACTATED RINGERS IV SOLN: INTRAVENOUS | Status: DC

## 2020-10-22 MED ORDER — FENTANYL CITRATE (PF) 250 MCG/5ML IJ SOLN
INTRAMUSCULAR | Status: AC
  Filled 2020-10-22: qty 5

## 2020-10-22 MED ORDER — HYDROCODONE-ACETAMINOPHEN 7.5-325 MG PO TABS
1.0000 | ORAL_TABLET | ORAL | Status: DC | PRN
  Administered 2020-10-23 – 2020-10-25 (×11): 2 via ORAL
  Filled 2020-10-22 (×11): qty 2

## 2020-10-22 MED ORDER — ACETAMINOPHEN 325 MG PO TABS
325.0000 mg | ORAL_TABLET | Freq: Once | ORAL | Status: DC | PRN
Start: 2020-10-22 — End: 2020-10-22

## 2020-10-22 MED ORDER — ACETAMINOPHEN 500 MG PO TABS
500.0000 mg | ORAL_TABLET | Freq: Four times a day (QID) | ORAL | Status: AC
  Administered 2020-10-23 (×3): 500 mg via ORAL
  Filled 2020-10-22 (×3): qty 1

## 2020-10-22 MED ORDER — SUCCINYLCHOLINE 20MG/ML (10ML) SYRINGE FOR MEDFUSION PUMP - OPTIME
INTRAMUSCULAR | Status: DC | PRN
  Administered 2020-10-22: 140 mg via INTRAVENOUS

## 2020-10-22 MED ORDER — ONDANSETRON HCL 4 MG/2ML IJ SOLN
INTRAMUSCULAR | Status: DC | PRN
  Administered 2020-10-22: 4 mg via INTRAVENOUS

## 2020-10-22 MED ORDER — ACETAMINOPHEN 160 MG/5ML PO SOLN: 325.0000 mg | Freq: Once | ORAL | Status: DC | PRN

## 2020-10-22 MED ORDER — PROPOFOL 10 MG/ML IV BOLUS
INTRAVENOUS | Status: DC | PRN
  Administered 2020-10-22: 200 mg via INTRAVENOUS

## 2020-10-22 MED ORDER — CEFAZOLIN SODIUM-DEXTROSE 2-3 GM-%(50ML) IV SOLR
INTRAVENOUS | Status: DC | PRN
  Administered 2020-10-22: 2 g via INTRAVENOUS

## 2020-10-22 MED ORDER — MEPERIDINE HCL 25 MG/ML IJ SOLN: 6.2500 mg | INTRAMUSCULAR | Status: DC | PRN

## 2020-10-22 SURGICAL SUPPLY — 39 items
ALCOHOL 70% 16 OZ (MISCELLANEOUS) ×2 IMPLANT
BAR GLASS FIBER EXFX 11X500 (EXFIX) ×4 IMPLANT
BNDG COHESIVE 6X5 TAN STRL LF (GAUZE/BANDAGES/DRESSINGS) ×4 IMPLANT
BNDG ELASTIC 4X5.8 VLCR STR LF (GAUZE/BANDAGES/DRESSINGS) ×2 IMPLANT
BNDG ELASTIC 6X5.8 VLCR STR LF (GAUZE/BANDAGES/DRESSINGS) ×4 IMPLANT
BNDG GAUZE ELAST 4 BULKY (GAUZE/BANDAGES/DRESSINGS) ×2 IMPLANT
COVER SURGICAL LIGHT HANDLE (MISCELLANEOUS) ×2 IMPLANT
DRAPE C-ARM 42X72 X-RAY (DRAPES) ×2 IMPLANT
DRAPE C-ARMOR (DRAPES) ×2 IMPLANT
DRAPE U-SHAPE 47X51 STRL (DRAPES) ×2 IMPLANT
DRSG XEROFORM 1X8 (GAUZE/BANDAGES/DRESSINGS) ×2 IMPLANT
ELECT REM PT RETURN 9FT ADLT (ELECTROSURGICAL) ×2
ELECTRODE REM PT RTRN 9FT ADLT (ELECTROSURGICAL) ×1 IMPLANT
GAUZE XEROFORM 5X9 LF (GAUZE/BANDAGES/DRESSINGS) ×2 IMPLANT
GLOVE BIO SURGEON STRL SZ7.5 (GLOVE) ×4 IMPLANT
GLOVE BIOGEL PI IND STRL 8 (GLOVE) ×2 IMPLANT
GLOVE BIOGEL PI INDICATOR 8 (GLOVE) ×2
GOWN STRL REUS W/ TWL LRG LVL3 (GOWN DISPOSABLE) ×1 IMPLANT
GOWN STRL REUS W/ TWL XL LVL3 (GOWN DISPOSABLE) ×3 IMPLANT
GOWN STRL REUS W/TWL LRG LVL3 (GOWN DISPOSABLE) ×2
GOWN STRL REUS W/TWL XL LVL3 (GOWN DISPOSABLE) ×10 IMPLANT
KIT BASIN OR (CUSTOM PROCEDURE TRAY) ×2 IMPLANT
KIT TURNOVER KIT B (KITS) ×2 IMPLANT
NEEDLE 18GX1X1/2 (RX/OR ONLY) (NEEDLE) ×2 IMPLANT
NS IRRIG 1000ML POUR BTL (IV SOLUTION) ×2 IMPLANT
PACK ORTHO EXTREMITY (CUSTOM PROCEDURE TRAY) ×2 IMPLANT
PAD ARMBOARD 7.5X6 YLW CONV (MISCELLANEOUS) ×4 IMPLANT
PIN CLAMP 2BAR 75MM BLUE (EXFIX) ×4 IMPLANT
PIN HALF ORANGE 5X200X45MM (EXFIX) ×4 IMPLANT
PIN HALF YELLOW 5X160X35 (EXFIX) ×4 IMPLANT
SPONGE LAP 18X18 RF (DISPOSABLE) ×2 IMPLANT
STOCKINETTE IMPERVIOUS LG (DRAPES) ×2 IMPLANT
SYR 50ML LL SCALE MARK (SYRINGE) ×2 IMPLANT
TOWEL GREEN STERILE (TOWEL DISPOSABLE) ×4 IMPLANT
TOWEL GREEN STERILE FF (TOWEL DISPOSABLE) ×4 IMPLANT
TUBE CONNECTING 12X1/4 (SUCTIONS) ×2 IMPLANT
UNDERPAD 30X36 HEAVY ABSORB (UNDERPADS AND DIAPERS) ×2 IMPLANT
WATER STERILE IRR 1000ML POUR (IV SOLUTION) ×4 IMPLANT
YANKAUER SUCT BULB TIP NO VENT (SUCTIONS) ×2 IMPLANT

## 2020-10-22 NOTE — Brief Op Note (Signed)
10/22/2020  10:02 PM  PATIENT:  Sharolyn Douglas  34 y.o. male  PRE-OPERATIVE DIAGNOSIS:  Left Knee Fracture  POST-OPERATIVE DIAGNOSIS:  Left Knee Fracture  PROCEDURE:  Procedure(s): EXTERNAL FIXATION LEFT KNEE (Left)  Arthrocentesis left knee  SURGEON:  Surgeon(s) and Role:    * Yolonda Kida, MD - Primary  PHYSICIAN ASSISTANT: Dion Saucier, PA-C  ANESTHESIA:   general  EBL:  5 cc  BLOOD ADMINISTERED:none  DRAINS: none   LOCAL MEDICATIONS USED:  NONE  SPECIMEN:  No Specimen  DISPOSITION OF SPECIMEN:  N/A  COUNTS:  YES  TOURNIQUET:  * Missing tourniquet times found for documented tourniquets in log: [id number] *  DICTATION: .Note written in EPIC  PLAN OF CARE: Admit to inpatient   PATIENT DISPOSITION:  PACU - hemodynamically stable.   Delay start of Pharmacological VTE agent (>24hrs) due to surgical blood loss or risk of bleeding: not applicable

## 2020-10-22 NOTE — ED Notes (Signed)
Ortho techs at bedside applying knee immobilizer
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
## 2020-10-22 NOTE — Transfer of Care (Signed)
Immediate Anesthesia Transfer of Care Note  Patient: Gabriel Fox  Procedure(s) Performed: EXTERNAL FIXATION LEFT KNEE (Left Knee)  Patient Location: PACU  Anesthesia Type:General  Level of Consciousness: sedated  Airway & Oxygen Therapy: Patient Spontanous Breathing  Post-op Assessment: Report given to RN and Post -op Vital signs reviewed and stable  Post vital signs: Reviewed and stable  Last Vitals:  Vitals Value Taken Time  BP 138/85 10/22/20 2218  Temp    Pulse 83 10/22/20 2218  Resp 19 10/22/20 2218  SpO2 99 % 10/22/20 2218  Vitals shown include unvalidated device data.  Last Pain:  Vitals:   10/22/20 1717  TempSrc: Oral  PainSc:          Complications: No complications documented.

## 2020-10-22 NOTE — Progress Notes (Signed)
Orthopedic Tech Progress Note Patient Details:  Gabriel Fox November 05, 1986 [id number]  Ortho Devices Type of Ortho Device: Knee Immobilizer Ortho Device/Splint Location: LLE Ortho Device/Splint Interventions: Ordered,Adjustment,Application   Post Interventions Patient Tolerated: Fair Instructions Provided: Care of device   Donavan Pore 10/22/2020, 8:06 PM

## 2020-10-22 NOTE — Anesthesia Preprocedure Evaluation (Addendum)
Anesthesia Evaluation  Patient identified by MRN, date of birth, ID band Patient awake    Reviewed: Allergy & Precautions, NPO status , Patient's Chart, lab work & pertinent test results  Airway Mallampati: II  TM Distance: >3 FB Neck ROM: Full    Dental  (+) Teeth Intact, Dental Advisory Given   Pulmonary neg pulmonary ROS,    breath sounds clear to auscultation       Cardiovascular Normal cardiovascular exam     Neuro/Psych negative neurological ROS  negative psych ROS   GI/Hepatic negative GI ROS, Neg liver ROS,   Endo/Other  negative endocrine ROS  Renal/GU negative Renal ROS     Musculoskeletal negative musculoskeletal ROS (+)   Abdominal Normal abdominal exam  (+)   Peds  Hematology negative hematology ROS (+)   Anesthesia Other Findings   Reproductive/Obstetrics                            Anesthesia Physical Anesthesia Plan  ASA: I and emergent  Anesthesia Plan: General   Post-op Pain Management:    Induction: Intravenous, Rapid sequence and Cricoid pressure planned  PONV Risk Score and Plan: 3 and Ondansetron, Dexamethasone and Midazolam  Airway Management Planned: Oral ETT  Additional Equipment: None  Intra-op Plan:   Post-operative Plan: Extubation in OR  Informed Consent: I have reviewed the patients History and Physical, chart, labs and discussed the procedure including the risks, benefits and alternatives for the proposed anesthesia with the patient or authorized representative who has indicated his/her understanding and acceptance.     Dental advisory given  Plan Discussed with: CRNA  Anesthesia Plan Comments:        Anesthesia Quick Evaluation

## 2020-10-22 NOTE — Anesthesia Procedure Notes (Signed)
Procedure Name: Intubation Date/Time: 10/22/2020 9:11 PM Performed by: Molli Hazard, CRNA Pre-anesthesia Checklist: Patient identified, Emergency Drugs available, Suction available and Patient being monitored Patient Re-evaluated:Patient Re-evaluated prior to induction Oxygen Delivery Method: Circle system utilized Preoxygenation: Pre-oxygenation with 100% oxygen Induction Type: IV induction, Rapid sequence and Cricoid Pressure applied Laryngoscope Size: Miller and 2 Grade View: Grade II Tube size: 7.5 mm Number of attempts: 1 Airway Equipment and Method: Stylet Placement Confirmation: ETT inserted through vocal cords under direct vision,  positive ETCO2 and breath sounds checked- equal and bilateral Secured at: 23 cm Tube secured with: Tape Dental Injury: Teeth and Oropharynx as per pre-operative assessment
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
## 2020-10-23 ENCOUNTER — Inpatient Hospital Stay (HOSPITAL_COMMUNITY): Payer: Self-pay

## 2020-10-23 ENCOUNTER — Encounter (HOSPITAL_COMMUNITY): Payer: Self-pay | Admitting: Orthopedic Surgery

## 2020-10-23 ENCOUNTER — Other Ambulatory Visit: Payer: Self-pay

## 2020-10-23 LAB — CBC WITH DIFFERENTIAL/PLATELET
Abs Immature Granulocytes: 0.03 10*3/uL (ref 0.00–0.07)
Basophils Absolute: 0 10*3/uL (ref 0.0–0.1)
Basophils Relative: 0 %
Eosinophils Absolute: 0 10*3/uL (ref 0.0–0.5)
Eosinophils Relative: 0 %
HCT: 38.3 % — ABNORMAL LOW (ref 39.0–52.0)
Hemoglobin: 12.5 g/dL — ABNORMAL LOW (ref 13.0–17.0)
Immature Granulocytes: 0 %
Lymphocytes Relative: 6 %
Lymphs Abs: 0.7 10*3/uL (ref 0.7–4.0)
MCH: 25.3 pg — ABNORMAL LOW (ref 26.0–34.0)
MCHC: 32.6 g/dL (ref 30.0–36.0)
MCV: 77.4 fL — ABNORMAL LOW (ref 80.0–100.0)
Monocytes Absolute: 0.6 10*3/uL (ref 0.1–1.0)
Monocytes Relative: 5 %
Neutro Abs: 10.6 10*3/uL — ABNORMAL HIGH (ref 1.7–7.7)
Neutrophils Relative %: 89 %
Platelets: 226 10*3/uL (ref 150–400)
RBC: 4.95 MIL/uL (ref 4.22–5.81)
RDW: 15.3 % (ref 11.5–15.5)
WBC: 11.9 10*3/uL — ABNORMAL HIGH (ref 4.0–10.5)
nRBC: 0 % (ref 0.0–0.2)

## 2020-10-23 LAB — CREATININE, SERUM
Creatinine, Ser: 1.26 mg/dL — ABNORMAL HIGH (ref 0.61–1.24)
GFR, Estimated: >60 mL/min (ref >60)

## 2020-10-23 LAB — HIV ANTIBODY (ROUTINE TESTING W REFLEX): HIV Screen 4th Generation wRfx: NONREACTIVE

## 2020-10-23 NOTE — Anesthesia Postprocedure Evaluation (Signed)
Anesthesia Post Note  Patient: Gabriel Fox  Procedure(s) Performed: EXTERNAL FIXATION LEFT KNEE (Left Knee)     Patient location during evaluation: PACU Anesthesia Type: General Level of consciousness: awake and alert Pain management: pain level controlled Vital Signs Assessment: post-procedure vital signs reviewed and stable Respiratory status: spontaneous breathing, nonlabored ventilation, respiratory function stable and patient connected to nasal cannula oxygen Cardiovascular status: blood pressure returned to baseline and stable Postop Assessment: no apparent nausea or vomiting Anesthetic complications: no   No complications documented.  Last Vitals:  Vitals:   10/22/20 2322 10/23/20 0345  BP: 128/90 130/64  Pulse: 73 73  Resp:  18  Temp: 36.9 C 36.9 C  SpO2: 95% 99%    Last Pain:  Vitals:   10/23/20 0603  TempSrc:   PainSc: 4                  Shelton Silvas
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
## 2020-10-23 NOTE — Progress Notes (Signed)
Pt arrived from PACU to unit.  Pt reports pain 5/10.  Left leg with external fixator - wrapped in ace/compression wrap.  Ice applied and extremity elevated.    Cap refill less than 3 seconds, extremity warm and dry, color appropriate for ethnicity, pulses good.    Pt oriented to room and unit.  Pt in bed with call bell and phone next to him.  Bed alarm on.

## 2020-10-23 NOTE — Progress Notes (Signed)
Gabriel Fox  MRN: [id number] DOB/Age: 1986-08-14 34 y.o. Physician: Lynnea Maizes, M.D. 1 Day Post-Op Procedure(s) (LRB): EXTERNAL FIXATION LEFT KNEE (Left)  Subjective: Patient resting in bed this a.m., alert and oriented.  Reports mild to moderate left lower extremity pain.  Denies nausea, vomiting. Vital Signs Temp:  [97.5 F (36.4 C)-98.5 F (36.9 C)] 97.7 F (36.5 C) (12/18 0759) Pulse Rate:  [72-88] 85 (12/18 0759) Resp:  [10-22] 19 (12/18 0759) BP: (122-144)/(54-101) 142/92 (12/18 0759) SpO2:  [94 %-100 %] 100 % (12/18 0759) Weight:  [93 kg] 93 kg (12/17 1714)  Lab Results Recent Labs    10/23/20 0029  WBC 11.9*  HGB 12.5*  HCT 38.3*  PLT 226   BMET Recent Labs    10/22/20 1956 10/23/20 0029  NA 138  --   K 4.1  --   CL 105  --   CO2 23  --   GLUCOSE 112*  --   BUN 10  --   CREATININE 1.13 1.26*  CALCIUM 9.2  --    INR  Date Value Ref Range Status  06/29/2020 1.0 0.8 - 1.2 Final    Comment:    (NOTE) INR goal varies based on device and disease states. Performed at [facility], [address], [place], [place] [address]      Exam  Left lower extremity with external fixator and bulky postop dressings in place.  Dressings dry.  Grossly neurovascular intact with fair ankle dorsi and plantar flexion mobility as well as active EHL and EDC mobility but strength decreased secondary to pain.  Compartments soft.  Plan Discussed importance of and technique for edema reduction.  Encouraged ankle and digital range of motion.  Continue icing and elevation.  Continue nonweightbearing.  Definitive fixation when swelling has improved Anea Fodera M Michele Judy 10/23/2020, 9:48 AM   Contact # [phone]
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
## 2020-10-24 ENCOUNTER — Encounter (HOSPITAL_COMMUNITY): Payer: Self-pay | Admitting: Orthopedic Surgery

## 2020-10-24 NOTE — Plan of Care (Signed)
Patient is s/p left external fixator to left knee on 12/17. Pain controlled with prn meds as ordered. Patient is working with PT and they recommend home health PT at discharge. No apparent distress or needs voiced otherwise. Will continue to monitor and continue current POC.
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
## 2020-10-24 NOTE — Plan of Care (Signed)
No acute changes since the previous night that I took care of him. Plan is for surgery on 12/20 pending reduced swelling. External fixator remains in place and pin care was completed by day shift nurse. Ice packs to left lower extremity to help with swelling. Will be NPO after MN tonight to prepare for surgery tomorrow. Will continue to monitor and continue current POC.

## 2020-10-25 ENCOUNTER — Inpatient Hospital Stay (HOSPITAL_COMMUNITY): Payer: Self-pay

## 2020-10-25 ENCOUNTER — Encounter (HOSPITAL_COMMUNITY): Payer: Self-pay | Admitting: Orthopedic Surgery

## 2020-10-25 ENCOUNTER — Inpatient Hospital Stay (HOSPITAL_COMMUNITY): Payer: Self-pay | Admitting: Anesthesiology

## 2020-10-25 ENCOUNTER — Encounter (HOSPITAL_COMMUNITY)
Admission: EM | Disposition: A | Discharge status: Home / Self Care | Payer: Self-pay | Source: Home / Self Care | Attending: Orthopedic Surgery

## 2020-10-25 DIAGNOSIS — S83282A Other tear of lateral meniscus, current injury, left knee, initial encounter: Secondary | ICD-10-CM

## 2020-10-25 SURGERY — OPEN REDUCTION INTERNAL FIXATION (ORIF) TIBIAL PLATEAU
Anesthesia: General | Laterality: Left

## 2020-10-25 MED ORDER — DEXAMETHASONE SODIUM PHOSPHATE 10 MG/ML IJ SOLN
INTRAMUSCULAR | Status: AC
  Filled 2020-10-25: qty 1

## 2020-10-25 MED ORDER — CEFAZOLIN SODIUM-DEXTROSE 2-4 GM/100ML-% IV SOLN
2.0000 g | Freq: Three times a day (TID) | INTRAVENOUS | Status: AC
  Administered 2020-10-25 – 2020-10-26 (×3): 2 g via INTRAVENOUS
  Filled 2020-10-25 (×3): qty 100

## 2020-10-25 MED ORDER — DEXMEDETOMIDINE HCL IN NACL 200 MCG/50ML IV SOLN
INTRAVENOUS | Status: DC | PRN
  Administered 2020-10-25: 20 ug via INTRAVENOUS
  Administered 2020-10-25: 8 ug via INTRAVENOUS

## 2020-10-25 MED ORDER — GLYCOPYRROLATE PF 0.2 MG/ML IJ SOSY
PREFILLED_SYRINGE | INTRAMUSCULAR | Status: AC
  Filled 2020-10-25: qty 1

## 2020-10-25 MED ORDER — ACETAMINOPHEN 10 MG/ML IV SOLN
INTRAVENOUS | Status: AC
  Administered 2020-10-25: 1000 mg via INTRAVENOUS
  Filled 2020-10-25: qty 100

## 2020-10-25 MED ORDER — ONDANSETRON HCL 4 MG/2ML IJ SOLN: 4.0000 mg | Freq: Once | INTRAMUSCULAR | Status: DC | PRN

## 2020-10-25 MED ORDER — MEPERIDINE HCL 25 MG/ML IJ SOLN: 6.2500 mg | INTRAMUSCULAR | Status: DC | PRN

## 2020-10-25 MED ORDER — OXYCODONE HCL 5 MG PO TABS
5.0000 mg | ORAL_TABLET | Freq: Once | ORAL | Status: DC | PRN
Start: 2020-10-25 — End: 2020-10-25

## 2020-10-25 MED ORDER — OXYCODONE HCL 5 MG/5ML PO SOLN: 5.0000 mg | Freq: Once | ORAL | Status: DC | PRN

## 2020-10-25 MED ORDER — CEFAZOLIN SODIUM-DEXTROSE 2-4 GM/100ML-% IV SOLN
INTRAVENOUS | Status: AC
  Filled 2020-10-25: qty 100

## 2020-10-25 MED ORDER — GLYCOPYRROLATE 0.2 MG/ML IJ SOLN
INTRAMUSCULAR | Status: DC | PRN
  Administered 2020-10-25: .2 mg via INTRAVENOUS

## 2020-10-25 MED ORDER — ONDANSETRON HCL 4 MG/2ML IJ SOLN
INTRAMUSCULAR | Status: AC
  Filled 2020-10-25: qty 2

## 2020-10-25 MED ORDER — 0.9 % SODIUM CHLORIDE (POUR BTL) OPTIME
TOPICAL | Status: DC | PRN
  Administered 2020-10-25: 1000 mL

## 2020-10-25 MED ORDER — FENTANYL CITRATE (PF) 100 MCG/2ML IJ SOLN
25.0000 ug | INTRAMUSCULAR | Status: DC | PRN
  Administered 2020-10-25: 50 ug via INTRAVENOUS

## 2020-10-25 MED ORDER — HYDROMORPHONE HCL 1 MG/ML IJ SOLN
INTRAMUSCULAR | Status: AC
  Administered 2020-10-25: 0.5 mg via INTRAVENOUS
  Filled 2020-10-25: qty 1

## 2020-10-25 MED ORDER — MIDAZOLAM HCL 2 MG/2ML IJ SOLN
INTRAMUSCULAR | Status: DC | PRN
  Administered 2020-10-25: 2 mg via INTRAVENOUS

## 2020-10-25 MED ORDER — KETOROLAC TROMETHAMINE 15 MG/ML IJ SOLN
15.0000 mg | Freq: Four times a day (QID) | INTRAMUSCULAR | Status: AC
  Administered 2020-10-25 – 2020-10-26 (×5): 15 mg via INTRAVENOUS
  Filled 2020-10-25 (×5): qty 1

## 2020-10-25 MED ORDER — ACETAMINOPHEN 160 MG/5ML PO SOLN: 325.0000 mg | ORAL | Status: DC | PRN

## 2020-10-25 MED ORDER — ACETAMINOPHEN 325 MG PO TABS: 325.0000 mg | ORAL_TABLET | ORAL | Status: DC | PRN

## 2020-10-25 MED ORDER — LIDOCAINE 2% (20 MG/ML) 5 ML SYRINGE
INTRAMUSCULAR | Status: AC
  Filled 2020-10-25: qty 5

## 2020-10-25 MED ORDER — DEXAMETHASONE SODIUM PHOSPHATE 10 MG/ML IJ SOLN
INTRAMUSCULAR | Status: DC | PRN
  Administered 2020-10-25: 10 mg via INTRAVENOUS

## 2020-10-25 MED ORDER — LIDOCAINE HCL (CARDIAC) PF 100 MG/5ML IV SOSY
PREFILLED_SYRINGE | INTRAVENOUS | Status: DC | PRN
  Administered 2020-10-25: 100 mg via INTRAVENOUS

## 2020-10-25 MED ORDER — SODIUM CHLORIDE (PF) 0.9 % IJ SOLN
INTRAMUSCULAR | Status: AC
  Filled 2020-10-25: qty 10

## 2020-10-25 MED ORDER — PROPOFOL 10 MG/ML IV BOLUS
INTRAVENOUS | Status: AC
  Filled 2020-10-25: qty 20

## 2020-10-25 MED ORDER — HYDROMORPHONE HCL 1 MG/ML IJ SOLN
0.5000 mg | INTRAMUSCULAR | Status: AC | PRN
  Administered 2020-10-25 (×2): 0.5 mg via INTRAVENOUS

## 2020-10-25 MED ORDER — SUFENTANIL CITRATE 50 MCG/ML IV SOLN
INTRAVENOUS | Status: AC
  Filled 2020-10-25: qty 1

## 2020-10-25 MED ORDER — VANCOMYCIN HCL 1000 MG IV SOLR
INTRAVENOUS | Status: AC
  Filled 2020-10-25: qty 1000

## 2020-10-25 MED ORDER — TOBRAMYCIN SULFATE 1.2 G IJ SOLR
INTRAMUSCULAR | Status: AC
  Filled 2020-10-25: qty 1.2

## 2020-10-25 MED ORDER — VANCOMYCIN HCL 1000 MG IV SOLR
INTRAVENOUS | Status: DC | PRN
  Administered 2020-10-25: 1000 mg

## 2020-10-25 MED ORDER — PROPOFOL 10 MG/ML IV BOLUS
INTRAVENOUS | Status: DC | PRN
  Administered 2020-10-25 (×2): 150 mg via INTRAVENOUS
  Administered 2020-10-25: 50 mg via INTRAVENOUS

## 2020-10-25 MED ORDER — FENTANYL CITRATE (PF) 100 MCG/2ML IJ SOLN
INTRAMUSCULAR | Status: AC
  Administered 2020-10-25: 50 ug via INTRAVENOUS
  Filled 2020-10-25: qty 2

## 2020-10-25 MED ORDER — ACETAMINOPHEN 10 MG/ML IV SOLN: 1000.0000 mg | Freq: Four times a day (QID) | INTRAVENOUS | Status: DC

## 2020-10-25 MED ORDER — OXYCODONE HCL 5 MG PO TABS
5.0000 mg | ORAL_TABLET | ORAL | Status: DC | PRN
  Administered 2020-10-25: 5 mg via ORAL
  Administered 2020-10-26 – 2020-10-27 (×7): 10 mg via ORAL
  Filled 2020-10-25 (×4): qty 2
  Filled 2020-10-25: qty 1
  Filled 2020-10-25 (×3): qty 2

## 2020-10-25 MED ORDER — SUFENTANIL CITRATE 50 MCG/ML IV SOLN
INTRAVENOUS | Status: DC | PRN
  Administered 2020-10-25 (×3): 10 ug via INTRAVENOUS

## 2020-10-25 MED ORDER — CEFAZOLIN SODIUM-DEXTROSE 2-3 GM-%(50ML) IV SOLR
INTRAVENOUS | Status: DC | PRN
  Administered 2020-10-25: 2 g via INTRAVENOUS

## 2020-10-25 MED ORDER — MIDAZOLAM HCL 2 MG/2ML IJ SOLN
INTRAMUSCULAR | Status: AC
  Filled 2020-10-25: qty 2

## 2020-10-25 MED ORDER — GABAPENTIN 100 MG PO CAPS
100.0000 mg | ORAL_CAPSULE | Freq: Three times a day (TID) | ORAL | Status: DC
  Administered 2020-10-25 – 2020-10-27 (×6): 100 mg via ORAL
  Filled 2020-10-25 (×6): qty 1

## 2020-10-25 MED ORDER — MORPHINE SULFATE (PF) 2 MG/ML IV SOLN
1.0000 mg | INTRAVENOUS | Status: DC | PRN
  Administered 2020-10-25: 1 mg via INTRAVENOUS
  Filled 2020-10-25 (×2): qty 1

## 2020-10-25 MED ORDER — LACTATED RINGERS IV SOLN: INTRAVENOUS | Status: DC | PRN

## 2020-10-25 MED ORDER — ACETAMINOPHEN 325 MG PO TABS
650.0000 mg | ORAL_TABLET | Freq: Four times a day (QID) | ORAL | Status: DC
  Administered 2020-10-25 – 2020-10-27 (×6): 650 mg via ORAL
  Filled 2020-10-25 (×7): qty 2

## 2020-10-25 SURGICAL SUPPLY — 84 items
BANDAGE ESMARK 6X9 LF (GAUZE/BANDAGES/DRESSINGS) ×1 IMPLANT
BIT DRILL 2.5 X LONG (BIT) ×1
BIT DRILL CALIBR QC 2.8X250 (BIT) ×1 IMPLANT
BIT DRILL X LONG 2.5 (BIT) IMPLANT
BLADE CLIPPER SURG (BLADE) ×1 IMPLANT
BLADE SURG 15 STRL LF DISP TIS (BLADE) ×1 IMPLANT
BLADE SURG 15 STRL SS (BLADE) ×2
BNDG ELASTIC 4X5.8 VLCR STR LF (GAUZE/BANDAGES/DRESSINGS) ×2 IMPLANT
BNDG ELASTIC 6X10 VLCR STRL LF (GAUZE/BANDAGES/DRESSINGS) ×1 IMPLANT
BNDG ELASTIC 6X5.8 VLCR STR LF (GAUZE/BANDAGES/DRESSINGS) ×2 IMPLANT
BNDG ESMARK 6X9 LF (GAUZE/BANDAGES/DRESSINGS) ×2
BNDG GAUZE ELAST 4 BULKY (GAUZE/BANDAGES/DRESSINGS) ×2 IMPLANT
BRUSH SCRUB EZ PLAIN DRY (MISCELLANEOUS) ×4 IMPLANT
CHLORAPREP W/TINT 26 (MISCELLANEOUS) ×4 IMPLANT
COVER SURGICAL LIGHT HANDLE (MISCELLANEOUS) ×2 IMPLANT
CUFF TOURN SGL QUICK 34 (TOURNIQUET CUFF) ×2
CUFF TRNQT CYL 34X4.125X (TOURNIQUET CUFF) ×1 IMPLANT
DRAPE C-ARM 42X72 X-RAY (DRAPES) ×2 IMPLANT
DRAPE C-ARMOR (DRAPES) ×2 IMPLANT
DRAPE ORTHO SPLIT 77X108 STRL (DRAPES) ×4
DRAPE SURG ORHT 6 SPLT 77X108 (DRAPES) ×2 IMPLANT
DRAPE U-SHAPE 47X51 STRL (DRAPES) ×2 IMPLANT
DRILL BIT X LONG 2.5 (BIT) ×2
DRSG MEPILEX BORDER 4X4 (GAUZE/BANDAGES/DRESSINGS) ×1 IMPLANT
DRSG MEPITEL 4X7.2 (GAUZE/BANDAGES/DRESSINGS) ×1 IMPLANT
DRSG PAD ABDOMINAL 8X10 ST (GAUZE/BANDAGES/DRESSINGS) ×2 IMPLANT
ELECT REM PT RETURN 9FT ADLT (ELECTROSURGICAL) ×2
ELECTRODE REM PT RTRN 9FT ADLT (ELECTROSURGICAL) ×1 IMPLANT
GAUZE SPONGE 4X4 12PLY STRL (GAUZE/BANDAGES/DRESSINGS) ×3 IMPLANT
GLOVE BIO SURGEON STRL SZ 6.5 (GLOVE) ×6 IMPLANT
GLOVE BIO SURGEON STRL SZ7.5 (GLOVE) ×8 IMPLANT
GLOVE BIOGEL PI IND STRL 6.5 (GLOVE) ×1 IMPLANT
GLOVE BIOGEL PI IND STRL 7.5 (GLOVE) ×1 IMPLANT
GLOVE BIOGEL PI INDICATOR 6.5 (GLOVE) ×1
GLOVE BIOGEL PI INDICATOR 7.5 (GLOVE) ×1
GOWN STRL REUS W/ TWL LRG LVL3 (GOWN DISPOSABLE) ×2 IMPLANT
GOWN STRL REUS W/TWL LRG LVL3 (GOWN DISPOSABLE) ×4
IMMOBILIZER KNEE 22 UNIV (SOFTGOODS) ×2 IMPLANT
KIT BASIN OR (CUSTOM PROCEDURE TRAY) ×2 IMPLANT
KIT TURNOVER KIT B (KITS) ×2 IMPLANT
NDL SUT 6 .5 CRC .975X.05 MAYO (NEEDLE) ×1 IMPLANT
NEEDLE MAYO TAPER (NEEDLE) ×2
NS IRRIG 1000ML POUR BTL (IV SOLUTION) ×2 IMPLANT
PACK TOTAL JOINT (CUSTOM PROCEDURE TRAY) ×2 IMPLANT
PAD ABD 8X10 STRL (GAUZE/BANDAGES/DRESSINGS) ×1 IMPLANT
PAD ARMBOARD 7.5X6 YLW CONV (MISCELLANEOUS) ×4 IMPLANT
PAD CAST 4YDX4 CTTN HI CHSV (CAST SUPPLIES) ×1 IMPLANT
PADDING CAST COTTON 4X4 STRL (CAST SUPPLIES) ×2
PADDING CAST COTTON 6X4 STRL (CAST SUPPLIES) ×2 IMPLANT
PADDING CAST SYNTHETIC 4 (CAST SUPPLIES) ×1
PADDING CAST SYNTHETIC 4X4 STR (CAST SUPPLIES) IMPLANT
PLATE VA-LCP TIBIA 8H 3.5MM (Plate) ×1 IMPLANT
SCREW CORT HEADED ST 3.5X44 (Screw) ×1 IMPLANT
SCREW CORTEX 3.5 34MM (Screw) ×1 IMPLANT
SCREW CORTEX 3.5 36MM (Screw) ×1 IMPLANT
SCREW CORTEX 3.5 40MM (Screw) ×1 IMPLANT
SCREW CORTEX 3.5 45MM (Screw) ×1 IMPLANT
SCREW HEADED ST 3.5X50 (Screw) ×1 IMPLANT
SCREW HEADED ST 3.5X65 (Screw) ×1 IMPLANT
SCREW HEADED ST 3.5X85 (Screw) ×1 IMPLANT
SCREW LOCK CORT ST 3.5X34 (Screw) IMPLANT
SCREW LOCK CORT ST 3.5X36 (Screw) IMPLANT
SCREW LOCK CORT ST 3.5X40 (Screw) IMPLANT
SCREW LOCKING 3.5X80MM VA (Screw) ×2 IMPLANT
SCREW LOCKING VA 3.5X75MM (Screw) ×3 IMPLANT
STAPLER VISISTAT 35W (STAPLE) ×2 IMPLANT
SUCTION FRAZIER HANDLE 10FR (MISCELLANEOUS) ×2
SUCTION TUBE FRAZIER 10FR DISP (MISCELLANEOUS) ×1 IMPLANT
SUT ETHILON 2 0 FS 18 (SUTURE) ×2 IMPLANT
SUT ETHILON 3 0 PS 1 (SUTURE) ×4 IMPLANT
SUT FIBERWIRE #2 38 T-5 BLUE (SUTURE) ×2
SUT VIC AB 0 CT1 27 (SUTURE) ×4
SUT VIC AB 0 CT1 27XBRD ANBCTR (SUTURE) IMPLANT
SUT VIC AB 0 CTX 18 (SUTURE) ×1 IMPLANT
SUT VIC AB 1 CT1 18XCR BRD 8 (SUTURE) IMPLANT
SUT VIC AB 1 CT1 27 (SUTURE) ×2
SUT VIC AB 1 CT1 27XBRD ANBCTR (SUTURE) ×1 IMPLANT
SUT VIC AB 1 CT1 8-18 (SUTURE)
SUT VIC AB 2-0 CT1 27 (SUTURE) ×4
SUT VIC AB 2-0 CT1 TAPERPNT 27 (SUTURE) ×2 IMPLANT
SUTURE FIBERWR #2 38 T-5 BLUE (SUTURE) IMPLANT
TOWEL GREEN STERILE (TOWEL DISPOSABLE) ×4 IMPLANT
TRAY FOLEY MTR SLVR 16FR STAT (SET/KITS/TRAYS/PACK) IMPLANT
WATER STERILE IRR 1000ML POUR (IV SOLUTION) ×4 IMPLANT

## 2020-10-25 NOTE — Anesthesia Postprocedure Evaluation (Signed)
Anesthesia Post Note  Patient: CAINAN TRULL  Procedure(s) Performed: OPEN REDUCTION INTERNAL FIXATION (ORIF) TIBIAL PLATEAU (Left )     Patient location during evaluation: PACU Anesthesia Type: General Level of consciousness: awake and alert Pain management: pain level controlled Vital Signs Assessment: post-procedure vital signs reviewed and stable Respiratory status: spontaneous breathing, nonlabored ventilation, respiratory function stable and patient connected to nasal cannula oxygen Cardiovascular status: blood pressure returned to baseline and stable Postop Assessment: no apparent nausea or vomiting Anesthetic complications: no   No complications documented.  Last Vitals:  Vitals:   10/25/20 1205 10/25/20 1231  BP: (!) 155/90 (!) 151/97  Pulse: 92 95  Resp: 16 15  Temp:  (!) 36.4 C  SpO2: (!) 89% 94%    Last Pain:  Vitals:   10/25/20 1231  TempSrc: Oral  PainSc:                  Joanie Duprey
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
## 2020-10-25 NOTE — Anesthesia Procedure Notes (Signed)
Procedure Name: LMA Insertion Date/Time: 10/25/2020 7:42 AM Performed by: Melina Schools, CRNA Pre-anesthesia Checklist: Patient identified, Emergency Drugs available, Suction available, Patient being monitored and Timeout performed Patient Re-evaluated:Patient Re-evaluated prior to induction Oxygen Delivery Method: Circle system utilized Preoxygenation: Pre-oxygenation with 100% oxygen Induction Type: IV induction Ventilation: Mask ventilation without difficulty LMA: LMA inserted LMA Size: 4.0 Number of attempts: 1 Placement Confirmation: positive ETCO2 and breath sounds checked- equal and bilateral Tube secured with: Tape Dental Injury: Teeth and Oropharynx as per pre-operative assessment

## 2020-10-25 NOTE — Transfer of Care (Signed)
Immediate Anesthesia Transfer of Care Note  Patient: Gabriel Fox  Procedure(s) Performed: OPEN REDUCTION INTERNAL FIXATION (ORIF) TIBIAL PLATEAU (Left )  Patient Location: PACU  Anesthesia Type:General  Level of Consciousness: drowsy, pateint uncooperative and responds to stimulation  Airway & Oxygen Therapy: Patient Spontanous Breathing and Patient connected to nasal cannula oxygen  Post-op Assessment: Report given to RN, Post -op Vital signs reviewed and stable and Patient moving all extremities X 4  Post vital signs: Reviewed and stable  Last Vitals:  Vitals Value Taken Time  BP 152/83 10/25/20 0958  Temp    Pulse 111 10/25/20 1005  Resp 13 10/25/20 1005  SpO2 99 % 10/25/20 1005  Vitals shown include unvalidated device data.  Last Pain:  Vitals:   10/25/20 0602  TempSrc:   PainSc: Asleep      Patients Stated Pain Goal: 3 (10/24/20 1719)  Complications: No complications documented.

## 2020-10-25 NOTE — Progress Notes (Signed)
Patient's cell phone at nurses' station per his request.

## 2020-10-25 NOTE — Progress Notes (Signed)
Orthopedic Tech Progress Note Patient Details:  JIONNI HELMING Apr 26, 1986 [id number]  Ortho Devices Type of Ortho Device: Knee Immobilizer Ortho Device/Splint Location: Left Lower Extremity Ortho Device/Splint Interventions: Ordered,Delivered to patients room   Post Interventions Patient Tolerated: Fair Instructions Provided: Care of device   Martisha Toulouse P Harle Stanford 10/25/2020, 1:27 PM

## 2020-10-25 NOTE — Progress Notes (Signed)
Report called to preop nurse, Marchelle Folks.
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
## 2020-10-25 NOTE — Interval H&P Note (Signed)
History and Physical Interval Note:  10/25/2020 7:31 AM  Gabriel Fox  has presented today for surgery, with the diagnosis of Left tibial plateau fracture.  The various methods of treatment have been discussed with the patient and family. After consideration of risks, benefits and other options for treatment, the patient has consented to  Procedure(s): OPEN REDUCTION INTERNAL FIXATION (ORIF) TIBIAL PLATEAU (Left) as a surgical intervention.  The patient's history has been reviewed, patient examined, no change in status, stable for surgery.  I have reviewed the patient's chart and labs.  Questions were answered to the patient's satisfaction.     Caryn Bee P Qaadir Kent

## 2020-10-25 NOTE — Plan of Care (Signed)
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
## 2020-10-25 NOTE — Discharge Instructions (Addendum)
Orthopaedic Trauma Service Discharge Instructions   General Discharge Instructions  WEIGHT BEARING STATUS:Non-weightbearing left lower extremity  RANGE OF MOTION/ACTIVITY: Ok for knee motion as tolerated  Wound Care: Incisions can be left open to air if there is no drainage. If incision continues to have drainage, follow wound care instructions below. Okay to shower if no drainage from incisions.  DVT/PE prophylaxis: Aspirin 325 mg twice daily  Diet: as you were eating previously.  Can use over the counter stool softeners and bowel preparations, such as Miralax, to help with bowel movements.  Narcotics can be constipating.  Be sure to drink plenty of fluids  PAIN MEDICATION USE AND EXPECTATIONS  You have likely been given narcotic medications to help control your pain.  After a traumatic event that results in an fracture (broken bone) with or without surgery, it is ok to use narcotic pain medications to help control one's pain.  We understand that everyone responds to pain differently and each individual patient will be evaluated on a regular basis for the continued need for narcotic medications. Ideally, narcotic medication use should last no more than 6-8 weeks (coinciding with fracture healing).   As a patient it is your responsibility as well to monitor narcotic medication use and report the amount and frequency you use these medications when you come to your office visit.   We would also advise that if you are using narcotic medications, you should take a dose prior to therapy to maximize you participation.  IF YOU ARE ON NARCOTIC MEDICATIONS IT IS NOT PERMISSIBLE TO OPERATE A MOTOR VEHICLE (MOTORCYCLE/CAR/TRUCK/MOPED) OR HEAVY MACHINERY DO NOT MIX NARCOTICS WITH OTHER CNS (CENTRAL NERVOUS SYSTEM) DEPRESSANTS SUCH AS ALCOHOL   STOP SMOKING OR USING NICOTINE PRODUCTS!!!!  As discussed nicotine severely impairs your body's ability to heal surgical and traumatic wounds but also impairs  bone healing.  Wounds and bone heal by forming microscopic blood vessels (angiogenesis) and nicotine is a vasoconstrictor (essentially, shrinks blood vessels).  Therefore, if vasoconstriction occurs to these microscopic blood vessels they essentially disappear and are unable to deliver necessary nutrients to the healing tissue.  This is one modifiable factor that you can do to dramatically increase your chances of healing your injury.    (This means no smoking, no nicotine gum, patches, etc)  DO NOT USE NONSTEROIDAL ANTI-INFLAMMATORY DRUGS (NSAID'S)  Using products such as Advil (ibuprofen), Aleve (naproxen), Motrin (ibuprofen) for additional pain control during fracture healing can delay and/or prevent the healing response.  If you would like to take over the counter (OTC) medication, Tylenol (acetaminophen) is ok.  However, some narcotic medications that are given for pain control contain acetaminophen as well. Therefore, you should not exceed more than 4000 mg of tylenol in a day if you do not have liver disease.  Also note that there are may OTC medicines, such as cold medicines and allergy medicines that my contain tylenol as well.  If you have any questions about medications and/or interactions please ask your doctor/PA or your pharmacist.      ICE AND ELEVATE INJURED/OPERATIVE EXTREMITY  Using ice and elevating the injured extremity above your heart can help with swelling and pain control.  Icing in a pulsatile fashion, such as 20 minutes on and 20 minutes off, can be followed.    Do not place ice directly on skin. Make sure there is a barrier between to skin and the ice pack.    Using frozen items such as frozen peas works well  as the conform nicely to the are that needs to be iced.  USE AN ACE WRAP OR TED HOSE FOR SWELLING CONTROL  In addition to icing and elevation, Ace wraps or TED hose are used to help limit and resolve swelling.  It is recommended to use Ace wraps or TED hose until you are  informed to stop.    When using Ace Wraps start the wrapping distally (farthest away from the body) and wrap proximally (closer to the body)   Example: If you had surgery on your leg or thing and you do not have a splint on, start the ace wrap at the toes and work your way up to the thigh        If you had surgery on your upper extremity and do not have a splint on, start the ace wrap at your fingers and work your way up to the upper arm  [place]: [phone]   VISIT OUR WEBSITE FOR ADDITIONAL INFORMATION: [email or web address]     Discharge Wound Care Instructions  Do NOT apply any ointments, solutions or lotions to pin sites or surgical wounds.  These prevent needed drainage and even though solutions like hydrogen peroxide kill bacteria, they also damage cells lining the pin sites that help fight infection.  Applying lotions or ointments can keep the wounds moist and can cause them to breakdown and open up as well. This can increase the risk for infection. When in doubt call the office.  Surgical incisions should be dressed daily.  If any drainage is noted, use one layer of adaptic, then gauze, Kerlix, and an ace wrap.  Once the incision is completely dry and without drainage, it may be left open to air out.  Showering may begin 36-48 hours later.  Cleaning gently with soap and water.  Traumatic wounds should be dressed daily as well.    One layer of adaptic, gauze, Kerlix, then ace wrap.  The adaptic can be discontinued once the draining has ceased    If you have a wet to dry dressing: wet the gauze with saline the squeeze as much saline out so the gauze is moist (not soaking wet), place moistened gauze over wound, then place a dry gauze over the moist one, followed by Kerlix wrap, then ace wrap.

## 2020-10-25 NOTE — Progress Notes (Signed)
Patient went down to preop via bed with transport. Cell phone placed in bedside drawer.

## 2020-10-25 NOTE — Anesthesia Preprocedure Evaluation (Addendum)
Anesthesia Evaluation  Patient identified by MRN, date of birth, ID band Patient awake    Reviewed: Allergy & Precautions, NPO status , Patient's Chart, lab work & pertinent test results  Airway Mallampati: II  TM Distance: >3 FB Neck ROM: Full    Dental  (+) Teeth Intact, Dental Advisory Given   Pulmonary neg pulmonary ROS,    breath sounds clear to auscultation       Cardiovascular Normal cardiovascular exam     Neuro/Psych negative neurological ROS  negative psych ROS   GI/Hepatic negative GI ROS, Neg liver ROS,   Endo/Other  negative endocrine ROS  Renal/GU negative Renal ROS     Musculoskeletal negative musculoskeletal ROS (+)   Abdominal Normal abdominal exam  (+) + obese,   Peds  Hematology negative hematology ROS (+)   Anesthesia Other Findings   Reproductive/Obstetrics                            Anesthesia Physical  Anesthesia Plan  ASA: I  Anesthesia Plan: General   Post-op Pain Management:    Induction: Intravenous, Rapid sequence and Cricoid pressure planned  PONV Risk Score and Plan: 3 and Ondansetron, Dexamethasone and Midazolam  Airway Management Planned: Oral ETT and LMA  Additional Equipment: None  Intra-op Plan:   Post-operative Plan: Extubation in OR  Informed Consent: I have reviewed the patients History and Physical, chart, labs and discussed the procedure including the risks, benefits and alternatives for the proposed anesthesia with the patient or authorized representative who has indicated his/her understanding and acceptance.     Dental advisory given  Plan Discussed with: CRNA and Anesthesiologist  Anesthesia Plan Comments:         Anesthesia Quick Evaluation

## 2020-10-26 ENCOUNTER — Encounter (HOSPITAL_COMMUNITY): Payer: Self-pay | Admitting: Student

## 2020-10-26 LAB — CBC
HCT: 34.8 % — ABNORMAL LOW (ref 39.0–52.0)
Hemoglobin: 11.4 g/dL — ABNORMAL LOW (ref 13.0–17.0)
MCH: 25.2 pg — ABNORMAL LOW (ref 26.0–34.0)
MCHC: 32.8 g/dL (ref 30.0–36.0)
MCV: 77 fL — ABNORMAL LOW (ref 80.0–100.0)
Platelets: 245 10*3/uL (ref 150–400)
RBC: 4.52 MIL/uL (ref 4.22–5.81)
RDW: 14.8 % (ref 11.5–15.5)
WBC: 10.3 10*3/uL (ref 4.0–10.5)
nRBC: 0 % (ref 0.0–0.2)

## 2020-10-26 LAB — VITAMIN D 25 HYDROXY (VIT D DEFICIENCY, FRACTURES): Vit D, 25-Hydroxy: 13.6 ng/mL — ABNORMAL LOW (ref 30–100)

## 2020-10-26 MED ORDER — VITAMIN D 25 MCG (1000 UNIT) PO TABS
2000.0000 [IU] | ORAL_TABLET | Freq: Two times a day (BID) | ORAL | Status: DC
  Administered 2020-10-26 – 2020-10-27 (×3): 2000 [IU] via ORAL
  Filled 2020-10-26 (×3): qty 2

## 2020-10-26 NOTE — TOC CAGE-AID Note (Signed)
Transition of Care [facility]) - CAGE-AID Screening   Patient Details  Name: Gabriel Fox MRN: [id number] Date of Birth: 14-Jun-1986  Clinical Narrative: Patient denies alcohol use. Patient endorses occasional marijuana use. He does not feel he needs to cut back and declines resources at this time.   CAGE-AID Screening:   Have You Ever Felt You Ought to Cut Down on Your Drinking or Drug Use?: No Have People Annoyed You By Critizing Your Drinking Or Drug Use?: No Have You Felt Bad Or Guilty About Your Drinking Or Drug Use?: No Have You Ever Had a Drink or Used Drugs First Thing In The Morning to Steady Your Nerves or to Get Rid of a Hangover?: No CAGE-AID Score: 0
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
## 2020-10-26 NOTE — Progress Notes (Signed)
Orthopaedic Trauma Progress Note  SUBJECTIVE: Reports mild pain about operative site. Has been moving the knee some. No chest pain. No SOB. No nausea/vomiting. No other complaints. Feels ready to get up and move around today with therapies  OBJECTIVE:  Vitals:   10/26/20 0512 10/26/20 0805  BP: 132/83 (!) 145/79  Pulse: 91 90  Resp: 18 17  Temp: 98 F (36.7 C) 98.4 F (36.9 C)  SpO2: 98% 98%    General: sitting up in bed, NAD Respiratory: No increased work of breathing.  LLE: Dressing CDI. Mild knee effusion. No significant tenderness throughout lower leg. Ankle dorsiflexion/plantarflexion intact. +EHL/+FHL. 2+ DP pulse. Endorses sensation to light touch distally.   IMAGING: Stable post op imaging.   LABS: No results found for this or any previous visit (from the past 24 hour(s)).  ASSESSMENT: Gabriel Fox is a 34 y.o. male, 1 Day Post-Op s/p ORIF LEFT TIBIAL PLATEAU  CV/Blood loss: Acute blood loss anemia, Hgb 12.5 pre-op. CBC pending this AM. Hemodynamically stable  PLAN: Weightbearing: NWB LLE. Ok for unrestricted ROM Incisional and dressing care:  Plan to remove dressing tomorrow Showering: Ok to begin showering 10/28/20 Orthopedic device(s): KI only to be worn at night x 2 weeks  Pain management:  1. Tylenol 650 mg q 6 hours scheduled 2. Robaxin 500 mg q 6 hours PRN 3. Oxycodone 5-10 mg q 4 hours PRN 4. Neurontin 100 mg TID 5. Morphine 1-2 mg q 2 hours PRN 6. Toradol 15 mg q 6 hours x 5 doses VTE prophylaxis: Lovenox, SCDs ID:  Ancef 2gm post op Foley/Lines:  No foley, KVO IVFs Impediments to Fracture Healing: Vit d level pending Dispo: PT/OT eval today. Plan for d/c home tomorrow if doing well with therapies and pain remains well controlled Follow - up plan: 2 weeks  Contact information:  Truitt Merle MD, Ulyses Southward PA-C. After hours and holidays please check [email or web address] for group call information for [facility]   Sarah A. Michaelyn Barter, PA-C [phone]  (office) [email or web address]
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
## 2020-10-27 ENCOUNTER — Other Ambulatory Visit (HOSPITAL_COMMUNITY): Payer: Self-pay | Admitting: Student

## 2020-10-27 LAB — CBC
HCT: 33.2 % — ABNORMAL LOW (ref 39.0–52.0)
Hemoglobin: 10.6 g/dL — ABNORMAL LOW (ref 13.0–17.0)
MCH: 24.8 pg — ABNORMAL LOW (ref 26.0–34.0)
MCHC: 31.9 g/dL (ref 30.0–36.0)
MCV: 77.8 fL — ABNORMAL LOW (ref 80.0–100.0)
Platelets: 230 10*3/uL (ref 150–400)
RBC: 4.27 MIL/uL (ref 4.22–5.81)
RDW: 14.7 % (ref 11.5–15.5)
WBC: 7.1 10*3/uL (ref 4.0–10.5)
nRBC: 0 % (ref 0.0–0.2)

## 2020-10-27 MED ORDER — GABAPENTIN 100 MG PO CAPS: 100.0000 mg | ORAL_CAPSULE | Freq: Three times a day (TID) | ORAL | 0 refills | Status: DC

## 2020-10-27 MED ORDER — VITAMIN D 125 MCG (5000 UT) PO CAPS: 5000.0000 [IU] | ORAL_CAPSULE | Freq: Every day | ORAL | 1 refills | Status: DC

## 2020-10-27 MED ORDER — ONDANSETRON HCL 4 MG PO TABS: 4.0000 mg | ORAL_TABLET | Freq: Four times a day (QID) | ORAL | 0 refills | Status: DC | PRN

## 2020-10-27 MED ORDER — OXYCODONE HCL 5 MG PO TABS: 5.0000 mg | ORAL_TABLET | ORAL | 0 refills | Status: DC | PRN

## 2020-10-27 MED ORDER — METHOCARBAMOL 500 MG PO TABS: 500.0000 mg | ORAL_TABLET | Freq: Four times a day (QID) | ORAL | 0 refills | Status: DC | PRN

## 2020-10-27 MED ORDER — ASPIRIN EC 325 MG PO TBEC: 325.0000 mg | DELAYED_RELEASE_TABLET | Freq: Two times a day (BID) | ORAL | 0 refills | Status: DC

## 2020-10-27 MED FILL — VITAMIN D3 5,000 UNIT TAB: 125 MCG | 60 days supply | Qty: 60 | Fill #0

## 2020-10-27 MED FILL — oxyCODONE HCL 5 MG TABS: 5 | 5 days supply | Qty: 60 | Fill #0

## 2020-10-27 MED FILL — ONDANSETRON HCL 4 MG TABLET: 4 | 5 days supply | Qty: 20 | Fill #0

## 2020-10-27 MED FILL — GABAPENTIN 100 MG CAPSULE: 100 | 10 days supply | Qty: 30 | Fill #0

## 2020-10-27 MED FILL — ASPIRIN EC 325 MG TABLET: 325 | 30 days supply | Qty: 60 | Fill #0

## 2020-10-27 MED FILL — METHOCARBAMOL 500 MG TABS: 500 | 7 days supply | Qty: 28 | Fill #0

## 2020-10-27 NOTE — Progress Notes (Signed)
Pt was discharged to home accompanied with significant other. DME has been provided. Discharge instructions were given to the patient and significant other.  All questions were answered.
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
## 2020-10-27 NOTE — TOC Transition Note (Signed)
Transition of Care [facility]) - CM/SW Discharge Note   Patient Details  Name: Gabriel Fox MRN: [id number] Date of Birth: [birth year]  [facility]) CM/SW Contact:  [name], RN Phone Number: 10/27/2020, 11:43 AM   Clinical Narrative:    Patient will DC to: Home Anticipated DC date: 10/27/2020 Family notified: Raliegh Ip ( girlfriend) Transport by: car     - s/p ex fix placement on 12/17, s/p ORIF left bicondylar tibial plateau fx and Removal of spanning knee external fixation on 12/20,  PMH significant for appendectomy and subdural hematoma  Per MD patient ready for DC today. RN, patient and patient's family aware of DC. Referral inplace for outpatient PTand noted on AVS. DME : rolling walker and 3 in 1/BSC will be delivered to bedside prior to d/c. Wheelchair will be pick up by pt from [employer] in [place]. TOC pharmacy to delivered Rx meds to bedside prior to d/c. Post hospital f/u appointments noted on AVS.  RNCM will sign off for now as intervention is no longer needed. Please consult [place] again if new needs arise.   Final next level of care: Home/Self Care (out patient PT) Barriers to Discharge: Continued Medical Work up   Patient Goals and CMS Choice        Discharge Placement                       Discharge Plan and Services   Discharge Planning Services: CM Consult            DME Arranged: 3-N-1,Lightweight manual wheelchair with seat cushion,Walker rolling DME Agency: [employer] Date DME Agency Contacted: 10/26/20 Time DME Agency Contacted: 1249 Representative spoke with at DME Agency: Velna Hatchet HH Arranged: PT (charity case) HH Agency: [facility] Date HH Agency Contacted: 10/26/20   Representative spoke with at [facility] Agency: [place]  Social Determinants of Health (SDOH) Interventions     Readmission Risk Interventions No flowsheet data found.
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
## 2020-11-01 ENCOUNTER — Ambulatory Visit: Payer: Self-pay | Attending: Student | Admitting: Physical Therapy

## 2020-11-01 ENCOUNTER — Encounter: Payer: Self-pay | Admitting: Physical Therapy

## 2020-11-01 ENCOUNTER — Other Ambulatory Visit: Payer: Self-pay

## 2020-11-01 DIAGNOSIS — M25662 Stiffness of left knee, not elsewhere classified: Secondary | ICD-10-CM | POA: Insufficient documentation

## 2020-11-01 DIAGNOSIS — M6281 Muscle weakness (generalized): Secondary | ICD-10-CM | POA: Insufficient documentation

## 2020-11-01 DIAGNOSIS — R600 Localized edema: Secondary | ICD-10-CM | POA: Insufficient documentation

## 2020-11-01 DIAGNOSIS — R2689 Other abnormalities of gait and mobility: Secondary | ICD-10-CM | POA: Insufficient documentation

## 2020-11-01 DIAGNOSIS — M25562 Pain in left knee: Secondary | ICD-10-CM | POA: Insufficient documentation
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
## 2020-11-01 NOTE — Patient Instructions (Signed)
Access Code: [email or web address] URL: [email or web address] Date: 11/01/2020 Prepared by: Rosana Hoes  Exercises Supine Quad Set - 3-4 x daily - 7 x weekly - 10 reps - 5 seconds hold Ankle Pumps in Elevation - 3-4 x daily Supine Heel Slide with Strap - 3-4 x daily - 7 x weekly - 10 reps - 5 seconds hold Supine Calf Stretch with Strap - 3-4 x daily - 7 x weekly - 3 reps - 20 seconds hold

## 2020-11-10 ENCOUNTER — Other Ambulatory Visit: Payer: Self-pay

## 2020-11-10 ENCOUNTER — Encounter: Payer: Self-pay | Admitting: Physical Therapy

## 2020-11-10 ENCOUNTER — Ambulatory Visit: Payer: Self-pay | Attending: Student | Admitting: Physical Therapy

## 2020-11-10 DIAGNOSIS — M25662 Stiffness of left knee, not elsewhere classified: Secondary | ICD-10-CM | POA: Insufficient documentation

## 2020-11-10 DIAGNOSIS — R600 Localized edema: Secondary | ICD-10-CM | POA: Insufficient documentation

## 2020-11-10 DIAGNOSIS — R2689 Other abnormalities of gait and mobility: Secondary | ICD-10-CM | POA: Insufficient documentation

## 2020-11-10 DIAGNOSIS — M6281 Muscle weakness (generalized): Secondary | ICD-10-CM | POA: Insufficient documentation

## 2020-11-10 DIAGNOSIS — M25562 Pain in left knee: Secondary | ICD-10-CM | POA: Insufficient documentation

## 2020-11-10 NOTE — Patient Instructions (Signed)
Access Code: [email or web address] URL: [email or web address] Date: 11/10/2020 Prepared by: Rosana Hoes  Exercises Sitting Heel Slide with Towel - 4-5 x daily - 7 x weekly - 10 reps - 5-10 seconds hold Long Sitting Quad Set - 4-5 x daily - 7 x weekly - 10 reps - 3 seconds hold Long Sitting Quad Set with Towel Roll Under Heel - 2-3 x daily - 7 x weekly - 10 reps - 3 seconds hold Supine Quad Set on Towel Roll - 2-3 x daily - 7 x weekly - 2 sets - 5 reps - 3 seconds hold Long Sitting Calf Stretch with Strap - 2-3 x daily - 7 x weekly - 5 reps - 20-30 seconds hold
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
## 2020-11-16 ENCOUNTER — Other Ambulatory Visit: Payer: Self-pay

## 2020-11-16 ENCOUNTER — Ambulatory Visit: Payer: Self-pay | Admitting: Physical Therapy

## 2020-11-16 ENCOUNTER — Encounter: Payer: Self-pay | Admitting: Physical Therapy

## 2020-11-16 DIAGNOSIS — M25562 Pain in left knee: Secondary | ICD-10-CM

## 2020-11-16 DIAGNOSIS — R2689 Other abnormalities of gait and mobility: Secondary | ICD-10-CM

## 2020-11-16 DIAGNOSIS — M6281 Muscle weakness (generalized): Secondary | ICD-10-CM

## 2020-11-16 DIAGNOSIS — R600 Localized edema: Secondary | ICD-10-CM

## 2020-11-16 DIAGNOSIS — M25662 Stiffness of left knee, not elsewhere classified: Secondary | ICD-10-CM

## 2020-11-16 NOTE — Patient Instructions (Addendum)
Access Code: [email or web address] URL: [email or web address] Date: 11/16/2020 Prepared by: Rosana Hoes  Exercises Sitting Heel Slide with Towel - 4-5 x daily - 7 x weekly - 10 reps - 5-10 seconds hold Long Sitting Quad Set - 4-5 x daily - 7 x weekly - 10 reps - 3 seconds hold Long Sitting Quad Set with Towel Roll Under Heel - 2-3 x daily - 7 x weekly - 10 reps - 3 seconds hold Supine Knee Extension Strengthening - 2-3 x daily - 7 x weekly - 10 reps - 3 seconds hold Long Sitting Calf Stretch with Strap - 2-3 x daily - 7 x weekly - 5 reps - 20-30 seconds hold Seated Quad Set - 2-3 x daily - 7 x weekly - 10 reps - 3 seconds hold    Hold behind thigh, let your leg relax and knee bend. Hold for 30-60 seconds Do this 2-3 times per day
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
## 2020-11-19 ENCOUNTER — Ambulatory Visit: Payer: Self-pay | Admitting: Physical Therapy

## 2020-11-23 ENCOUNTER — Ambulatory Visit: Payer: Self-pay | Admitting: Physical Therapy

## 2020-11-25 ENCOUNTER — Inpatient Hospital Stay: Payer: Self-pay | Admitting: Internal Medicine

## 2020-11-25 ENCOUNTER — Encounter: Payer: Self-pay | Admitting: Physical Therapy

## 2020-11-25 ENCOUNTER — Other Ambulatory Visit: Payer: Self-pay

## 2020-11-25 ENCOUNTER — Ambulatory Visit: Payer: Self-pay | Admitting: Physical Therapy

## 2020-11-25 DIAGNOSIS — M25662 Stiffness of left knee, not elsewhere classified: Secondary | ICD-10-CM

## 2020-11-25 DIAGNOSIS — M6281 Muscle weakness (generalized): Secondary | ICD-10-CM

## 2020-11-25 DIAGNOSIS — R600 Localized edema: Secondary | ICD-10-CM

## 2020-11-25 DIAGNOSIS — R2689 Other abnormalities of gait and mobility: Secondary | ICD-10-CM

## 2020-11-25 DIAGNOSIS — M25562 Pain in left knee: Secondary | ICD-10-CM
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
## 2020-11-27 ENCOUNTER — Other Ambulatory Visit: Payer: Self-pay

## 2020-11-27 ENCOUNTER — Ambulatory Visit (HOSPITAL_COMMUNITY)
Admission: EM | Admit: 2020-11-27 | Discharge: 2020-11-27 | Disposition: A | Discharge status: Home / Self Care | Payer: Self-pay | Attending: Internal Medicine | Admitting: Internal Medicine

## 2020-11-27 ENCOUNTER — Encounter (HOSPITAL_COMMUNITY): Payer: Self-pay | Admitting: Emergency Medicine

## 2020-11-27 DIAGNOSIS — T8149XA Infection following a procedure, other surgical site, initial encounter: Secondary | ICD-10-CM

## 2020-11-27 MED ORDER — DOXYCYCLINE HYCLATE 100 MG PO CAPS: 100.0000 mg | ORAL_CAPSULE | Freq: Two times a day (BID) | ORAL | 0 refills | Status: AC
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
## 2020-11-27 NOTE — Discharge Instructions (Signed)
Please take medications as prescribed Please call your orthopedic surgeon as soon as possible to be evaluated in the office

## 2020-11-27 NOTE — ED Triage Notes (Signed)
Pt states that after his stiches were removed two weeks ago his incision and felt fine but he noticed a few days later he noticed some swelling, yellow discharge and bleeding. Pt states that he cleaned the wound with water, soap, and peroxide. Pt states that he cant move his leg without pain a throbbing pain, pt states that he cant touch it without pain shooting.

## 2020-11-29 ENCOUNTER — Ambulatory Visit: Payer: Self-pay | Admitting: Physical Therapy

## 2020-12-01 ENCOUNTER — Encounter: Payer: Self-pay | Admitting: Physical Therapy

## 2020-12-01 ENCOUNTER — Other Ambulatory Visit: Payer: Self-pay

## 2020-12-01 ENCOUNTER — Ambulatory Visit: Payer: Self-pay | Admitting: Physical Therapy

## 2020-12-01 DIAGNOSIS — M6281 Muscle weakness (generalized): Secondary | ICD-10-CM

## 2020-12-01 DIAGNOSIS — R2689 Other abnormalities of gait and mobility: Secondary | ICD-10-CM

## 2020-12-01 DIAGNOSIS — M25562 Pain in left knee: Secondary | ICD-10-CM

## 2020-12-01 DIAGNOSIS — R600 Localized edema: Secondary | ICD-10-CM

## 2020-12-01 DIAGNOSIS — M25662 Stiffness of left knee, not elsewhere classified: Secondary | ICD-10-CM
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
## 2020-12-09 ENCOUNTER — Telehealth: Payer: Self-pay | Admitting: Physical Therapy

## 2020-12-09 ENCOUNTER — Ambulatory Visit: Payer: Self-pay | Attending: Student | Admitting: Physical Therapy

## 2020-12-09 DIAGNOSIS — M25562 Pain in left knee: Secondary | ICD-10-CM | POA: Insufficient documentation

## 2020-12-09 DIAGNOSIS — R2689 Other abnormalities of gait and mobility: Secondary | ICD-10-CM | POA: Insufficient documentation

## 2020-12-09 DIAGNOSIS — M6281 Muscle weakness (generalized): Secondary | ICD-10-CM | POA: Insufficient documentation

## 2020-12-09 DIAGNOSIS — M25662 Stiffness of left knee, not elsewhere classified: Secondary | ICD-10-CM | POA: Insufficient documentation

## 2020-12-09 DIAGNOSIS — R600 Localized edema: Secondary | ICD-10-CM | POA: Insufficient documentation

## 2020-12-09 NOTE — Telephone Encounter (Signed)
Attempted to contact patient due to missed PT appointment. No answer and unable to leave VM.  Rosana Hoes, PT, DPT, LAT, ATC 12/09/20  1:00 PM Phone: [phone] Fax: [phone]

## 2020-12-17 ENCOUNTER — Encounter: Payer: Self-pay | Admitting: Physical Therapy

## 2020-12-17 ENCOUNTER — Other Ambulatory Visit: Payer: Self-pay

## 2020-12-17 ENCOUNTER — Ambulatory Visit: Payer: Self-pay | Admitting: Physical Therapy

## 2020-12-17 DIAGNOSIS — R600 Localized edema: Secondary | ICD-10-CM

## 2020-12-17 DIAGNOSIS — M25562 Pain in left knee: Secondary | ICD-10-CM

## 2020-12-17 DIAGNOSIS — M6281 Muscle weakness (generalized): Secondary | ICD-10-CM

## 2020-12-17 DIAGNOSIS — M25662 Stiffness of left knee, not elsewhere classified: Secondary | ICD-10-CM

## 2020-12-17 DIAGNOSIS — R2689 Other abnormalities of gait and mobility: Secondary | ICD-10-CM
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
## 2020-12-21 ENCOUNTER — Ambulatory Visit: Payer: Self-pay | Admitting: Physical Therapy

## 2020-12-21 ENCOUNTER — Encounter: Payer: Self-pay | Admitting: Physical Therapy

## 2020-12-21 ENCOUNTER — Other Ambulatory Visit: Payer: Self-pay

## 2020-12-21 DIAGNOSIS — M6281 Muscle weakness (generalized): Secondary | ICD-10-CM

## 2020-12-21 DIAGNOSIS — M25662 Stiffness of left knee, not elsewhere classified: Secondary | ICD-10-CM

## 2020-12-21 DIAGNOSIS — R2689 Other abnormalities of gait and mobility: Secondary | ICD-10-CM

## 2020-12-21 DIAGNOSIS — M25562 Pain in left knee: Secondary | ICD-10-CM

## 2020-12-21 DIAGNOSIS — R600 Localized edema: Secondary | ICD-10-CM
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
## 2020-12-23 ENCOUNTER — Encounter: Payer: Self-pay | Admitting: Physical Therapy

## 2020-12-23 ENCOUNTER — Other Ambulatory Visit: Payer: Self-pay

## 2020-12-23 ENCOUNTER — Ambulatory Visit: Payer: Self-pay | Admitting: Physical Therapy

## 2020-12-23 DIAGNOSIS — M25562 Pain in left knee: Secondary | ICD-10-CM

## 2020-12-23 DIAGNOSIS — R2689 Other abnormalities of gait and mobility: Secondary | ICD-10-CM

## 2020-12-23 DIAGNOSIS — M6281 Muscle weakness (generalized): Secondary | ICD-10-CM

## 2020-12-23 DIAGNOSIS — R600 Localized edema: Secondary | ICD-10-CM

## 2020-12-23 DIAGNOSIS — M25662 Stiffness of left knee, not elsewhere classified: Secondary | ICD-10-CM
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
## 2020-12-28 ENCOUNTER — Ambulatory Visit: Payer: Self-pay | Admitting: Physical Therapy

## 2020-12-28 ENCOUNTER — Other Ambulatory Visit: Payer: Self-pay

## 2020-12-28 DIAGNOSIS — R2689 Other abnormalities of gait and mobility: Secondary | ICD-10-CM

## 2020-12-28 DIAGNOSIS — M25562 Pain in left knee: Secondary | ICD-10-CM

## 2020-12-28 DIAGNOSIS — R600 Localized edema: Secondary | ICD-10-CM

## 2020-12-28 DIAGNOSIS — M25662 Stiffness of left knee, not elsewhere classified: Secondary | ICD-10-CM

## 2020-12-28 DIAGNOSIS — M6281 Muscle weakness (generalized): Secondary | ICD-10-CM
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
## 2020-12-30 ENCOUNTER — Ambulatory Visit: Payer: Self-pay | Admitting: Physical Therapy

## 2020-12-30 ENCOUNTER — Telehealth: Payer: Self-pay | Admitting: Physical Therapy

## 2020-12-30 NOTE — Telephone Encounter (Signed)
Attempted to contact patient due to missed PT appointment. Not able to complete call and unable to leave message.  Rosana Hoes, PT, DPT, LAT, ATC 12/30/20  3:08 PM Phone: [phone] Fax: [phone]

## 2021-01-05 ENCOUNTER — Ambulatory Visit: Payer: Self-pay | Admitting: Physical Therapy

## 2021-01-12 ENCOUNTER — Encounter: Payer: Self-pay | Admitting: Physical Therapy

## 2021-01-12 ENCOUNTER — Ambulatory Visit: Payer: Self-pay | Attending: Student | Admitting: Physical Therapy

## 2021-01-12 ENCOUNTER — Other Ambulatory Visit: Payer: Self-pay

## 2021-01-12 DIAGNOSIS — R2689 Other abnormalities of gait and mobility: Secondary | ICD-10-CM | POA: Insufficient documentation

## 2021-01-12 DIAGNOSIS — M25562 Pain in left knee: Secondary | ICD-10-CM | POA: Insufficient documentation

## 2021-01-12 DIAGNOSIS — R600 Localized edema: Secondary | ICD-10-CM | POA: Insufficient documentation

## 2021-01-12 DIAGNOSIS — M25662 Stiffness of left knee, not elsewhere classified: Secondary | ICD-10-CM | POA: Insufficient documentation

## 2021-01-12 DIAGNOSIS — M6281 Muscle weakness (generalized): Secondary | ICD-10-CM | POA: Insufficient documentation
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
## 2021-01-18 ENCOUNTER — Ambulatory Visit: Payer: Self-pay | Admitting: Physical Therapy

## 2021-01-18 ENCOUNTER — Other Ambulatory Visit: Payer: Self-pay

## 2021-01-18 DIAGNOSIS — R2689 Other abnormalities of gait and mobility: Secondary | ICD-10-CM

## 2021-01-18 DIAGNOSIS — M25562 Pain in left knee: Secondary | ICD-10-CM

## 2021-01-18 DIAGNOSIS — R600 Localized edema: Secondary | ICD-10-CM

## 2021-01-18 DIAGNOSIS — M25662 Stiffness of left knee, not elsewhere classified: Secondary | ICD-10-CM

## 2021-01-18 DIAGNOSIS — M6281 Muscle weakness (generalized): Secondary | ICD-10-CM
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
## 2021-01-20 ENCOUNTER — Ambulatory Visit: Payer: Self-pay | Admitting: Physical Therapy

## 2021-01-20 ENCOUNTER — Other Ambulatory Visit: Payer: Self-pay

## 2021-01-20 ENCOUNTER — Encounter: Payer: Self-pay | Admitting: Physical Therapy

## 2021-01-20 DIAGNOSIS — R2689 Other abnormalities of gait and mobility: Secondary | ICD-10-CM

## 2021-01-20 DIAGNOSIS — M25662 Stiffness of left knee, not elsewhere classified: Secondary | ICD-10-CM

## 2021-01-20 DIAGNOSIS — R600 Localized edema: Secondary | ICD-10-CM

## 2021-01-20 DIAGNOSIS — M25562 Pain in left knee: Secondary | ICD-10-CM

## 2021-01-20 DIAGNOSIS — M6281 Muscle weakness (generalized): Secondary | ICD-10-CM
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
## 2021-01-25 ENCOUNTER — Ambulatory Visit: Payer: Self-pay | Admitting: Physical Therapy

## 2021-01-25 ENCOUNTER — Other Ambulatory Visit: Payer: Self-pay

## 2021-01-25 ENCOUNTER — Encounter: Payer: Self-pay | Admitting: Physical Therapy

## 2021-01-25 DIAGNOSIS — M25562 Pain in left knee: Secondary | ICD-10-CM

## 2021-01-25 DIAGNOSIS — R2689 Other abnormalities of gait and mobility: Secondary | ICD-10-CM

## 2021-01-25 DIAGNOSIS — R600 Localized edema: Secondary | ICD-10-CM

## 2021-01-25 DIAGNOSIS — M6281 Muscle weakness (generalized): Secondary | ICD-10-CM

## 2021-01-25 DIAGNOSIS — M25662 Stiffness of left knee, not elsewhere classified: Secondary | ICD-10-CM
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
## 2021-01-27 ENCOUNTER — Telehealth: Payer: Self-pay | Admitting: Physical Therapy

## 2021-01-27 ENCOUNTER — Ambulatory Visit: Payer: Self-pay | Admitting: Physical Therapy

## 2021-01-27 NOTE — Telephone Encounter (Signed)
Attempted to contact patient due to missed PT appointment. No answer and unable to leave VM because phone kept ringing with no voice mailbox.   Rosana Hoes, PT, DPT, LAT, ATC 01/27/21  3:14 PM Phone: [phone] Fax: [phone]

## 2021-02-01 ENCOUNTER — Ambulatory Visit: Payer: Self-pay | Admitting: Physical Therapy

## 2021-02-02 ENCOUNTER — Other Ambulatory Visit: Payer: Self-pay

## 2021-02-02 ENCOUNTER — Encounter: Payer: Self-pay | Admitting: Physical Therapy

## 2021-02-02 ENCOUNTER — Ambulatory Visit: Payer: Self-pay | Admitting: Physical Therapy

## 2021-02-02 DIAGNOSIS — M25562 Pain in left knee: Secondary | ICD-10-CM

## 2021-02-02 DIAGNOSIS — R2689 Other abnormalities of gait and mobility: Secondary | ICD-10-CM

## 2021-02-02 DIAGNOSIS — M25662 Stiffness of left knee, not elsewhere classified: Secondary | ICD-10-CM

## 2021-02-02 DIAGNOSIS — M6281 Muscle weakness (generalized): Secondary | ICD-10-CM

## 2021-02-02 DIAGNOSIS — R600 Localized edema: Secondary | ICD-10-CM
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
## 2021-02-08 ENCOUNTER — Ambulatory Visit: Payer: Self-pay | Attending: Student

## 2021-02-08 ENCOUNTER — Other Ambulatory Visit: Payer: Self-pay

## 2021-02-08 DIAGNOSIS — M6281 Muscle weakness (generalized): Secondary | ICD-10-CM | POA: Insufficient documentation

## 2021-02-08 DIAGNOSIS — M25562 Pain in left knee: Secondary | ICD-10-CM | POA: Insufficient documentation

## 2021-02-08 DIAGNOSIS — M25662 Stiffness of left knee, not elsewhere classified: Secondary | ICD-10-CM | POA: Insufficient documentation

## 2021-02-08 DIAGNOSIS — R2689 Other abnormalities of gait and mobility: Secondary | ICD-10-CM | POA: Insufficient documentation

## 2021-02-08 DIAGNOSIS — R600 Localized edema: Secondary | ICD-10-CM | POA: Insufficient documentation
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
## 2021-02-10 ENCOUNTER — Other Ambulatory Visit: Payer: Self-pay

## 2021-02-10 ENCOUNTER — Encounter: Payer: Self-pay | Admitting: Physical Therapy

## 2021-02-10 ENCOUNTER — Ambulatory Visit: Payer: Self-pay | Admitting: Physical Therapy

## 2021-02-10 DIAGNOSIS — M25662 Stiffness of left knee, not elsewhere classified: Secondary | ICD-10-CM

## 2021-02-10 DIAGNOSIS — M6281 Muscle weakness (generalized): Secondary | ICD-10-CM

## 2021-02-10 DIAGNOSIS — R600 Localized edema: Secondary | ICD-10-CM

## 2021-02-10 DIAGNOSIS — M25562 Pain in left knee: Secondary | ICD-10-CM

## 2021-02-10 DIAGNOSIS — R2689 Other abnormalities of gait and mobility: Secondary | ICD-10-CM
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
## 2021-03-04 ENCOUNTER — Telehealth: Payer: Self-pay | Admitting: Physical Therapy

## 2021-03-04 ENCOUNTER — Ambulatory Visit: Payer: Self-pay | Admitting: Physical Therapy

## 2021-03-04 NOTE — Telephone Encounter (Signed)
Attempted to contact patient due to missed PT appointment. Spoke with patient's brother, instructed him to ask patient to call the clinic when able. Will re-attempt call at later date.  Rosana Hoes, PT, DPT, LAT, ATC 03/04/21  12:15 PM Phone: [phone] Fax: [phone]
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
## 2022-07-25 ENCOUNTER — Emergency Department (HOSPITAL_BASED_OUTPATIENT_CLINIC_OR_DEPARTMENT_OTHER): Payer: Self-pay | Admitting: Radiology

## 2022-07-25 ENCOUNTER — Emergency Department (HOSPITAL_BASED_OUTPATIENT_CLINIC_OR_DEPARTMENT_OTHER)
Admission: EM | Admit: 2022-07-25 | Discharge: 2022-07-25 | Disposition: A | Discharge status: Home / Self Care | Payer: Self-pay | Attending: Emergency Medicine | Admitting: Emergency Medicine

## 2022-07-25 ENCOUNTER — Encounter (HOSPITAL_BASED_OUTPATIENT_CLINIC_OR_DEPARTMENT_OTHER): Payer: Self-pay

## 2022-07-25 DIAGNOSIS — Z20822 Contact with and (suspected) exposure to covid-19: Secondary | ICD-10-CM | POA: Insufficient documentation

## 2022-07-25 DIAGNOSIS — R0789 Other chest pain: Secondary | ICD-10-CM | POA: Insufficient documentation

## 2022-07-25 DIAGNOSIS — J069 Acute upper respiratory infection, unspecified: Secondary | ICD-10-CM | POA: Insufficient documentation

## 2022-07-25 LAB — SARS CORONAVIRUS 2 BY RT PCR: SARS Coronavirus 2 by RT PCR: NEGATIVE

## 2022-07-25 MED ORDER — HYDROCOD POLI-CHLORPHE POLI ER 10-8 MG/5ML PO SUER
5.0000 mL | Freq: Once | ORAL | Status: AC
  Administered 2022-07-25: 5 mL via ORAL
  Filled 2022-07-25: qty 5

## 2022-07-25 MED ORDER — HYDROCOD POLI-CHLORPHE POLI ER 10-8 MG/5ML PO SUER: 5.0000 mL | Freq: Two times a day (BID) | ORAL | 0 refills | Status: DC | PRN

## 2022-07-25 MED ORDER — NAPROXEN 250 MG PO TABS
500.0000 mg | ORAL_TABLET | Freq: Once | ORAL | Status: AC
  Administered 2022-07-25: 500 mg via ORAL
  Filled 2022-07-25: qty 2

## 2022-07-25 MED ORDER — NAPROXEN 375 MG PO TABS: ORAL_TABLET | ORAL | 0 refills | Status: DC

## 2022-07-25 NOTE — ED Triage Notes (Signed)
Pt states that he has had a cough for the past few days with yellow phlegm, with CP and SOB, chills at home

## 2022-07-25 NOTE — ED Provider Notes (Signed)
[facility] Provider Note: Georgena Spurling, MD, FACEP  CSN: [id number] MRN: [id number] ARRIVAL: 07/25/22 at [place]: [place]  Cough   HISTORY OF PRESENT ILLNESS  07/25/22 3:54 AM Gabriel Fox is a 36 y.o. male with 3 days of a cough productive of yellow's phlegm.  He has had shortness of breath with this as well as pain in his lower sternum with coughing or breathing.  He has had chills and a low-grade fever as well.  He rates the pain in his chest as an 8 out of 10.    History reviewed. No pertinent past medical history.  Past Surgical History:  Procedure Laterality Date   APPENDECTOMY     BURR HOLE Left 05/24/2017   Procedure: BURR HOLE Craniectomy;  Surgeon: Kary Kos, MD;  Location: [facility];  Service: Neurosurgery;  Laterality: Left;   EXTERNAL FIXATION LEG Left 10/22/2020   Procedure: EXTERNAL FIXATION LEFT KNEE;  Surgeon: Nicholes Stairs, MD;  Location: [facility];  Service: Orthopedics;  Laterality: Left;   ORIF TIBIA PLATEAU Left 10/25/2020   Procedure: OPEN REDUCTION INTERNAL FIXATION (ORIF) TIBIAL PLATEAU;  Surgeon: Shona Needles, MD;  Location: [place];  Service: Orthopedics;  Laterality: Left;    Family History  Family history unknown: Yes    Social History   Tobacco Use   Smoking status: Never   Smokeless tobacco: Never   Tobacco comments:    stress  Vaping Use   Vaping Use: Never used  Substance Use Topics   Alcohol use: Not Currently    Comment: denies   Drug use: Not Currently    Types: Marijuana    Comment: denies    Prior to Admission medications   Medication Sig Start Date End Date Taking? Authorizing Provider  chlorpheniramine-HYDROcodone (TUSSIONEX) 10-8 MG/5ML Take 5 mLs by mouth every 12 (twelve) hours as needed for cough. 07/25/22  Yes Jasmeet Manton, MD  naproxen (NAPROSYN) 375 MG tablet Take 1 tablet twice daily as needed for chest wall pain. 07/25/22  Yes Kortnee Bas, MD  Cholecalciferol (VITAMIN D) 125  MCG (5000 UT) CAPS Take 5,000 Units by mouth daily. 10/27/20   Corinne Ports, PA-C  gabapentin (NEURONTIN) 100 MG capsule TAKE 1 CAPSULE (100 MG TOTAL) BY MOUTH THREE TIMES DAILY. 10/27/20 10/27/21  Corinne Ports, PA-C    Allergies Patient has no known allergies.   REVIEW OF SYSTEMS  Negative except as noted here or in the History of Present Illness.   PHYSICAL EXAMINATION  Initial Vital Signs Blood pressure 133/77, pulse 98, temperature 99 F (37.2 C), temperature source Oral, resp. rate (!) 29, height 5\' 7"  (1.702 m), weight 93 kg, SpO2 96 %.  Examination General: Well-developed, well-nourished male in no acute distress; appearance consistent with age of record HENT: normocephalic; atraumatic Eyes: Normal appearance Neck: supple Heart: regular rate and rhythm Lungs: clear to auscultation bilaterally Chest: Mild's lower sternal tenderness Abdomen: soft; nondistended; nontender; bowel sounds present Extremities: No deformity; full range of motion Neurologic: Awake, alert and oriented; motor function intact in all extremities and symmetric; no facial droop Skin: Warm and dry Psychiatric: Normal mood and affect   RESULTS  Summary of this visit's results, reviewed and interpreted by myself:   EKG Interpretation  Date/Time:  Tuesday July 25 2022 03:50:35 EDT Ventricular Rate:  89 PR Interval:  150 QRS Duration: 84 QT Interval:  329 QTC Calculation: 401 R Axis:   65 Text Interpretation: Sinus rhythm Normal ECG  Confirmed by Shanon Rosser [phone]) on 07/25/2022 3:54:01 AM       Laboratory Studies: Results for orders placed or performed during the hospital encounter of 07/25/22 (from the past 24 hour(s))  SARS Coronavirus 2 by RT PCR (hospital order, performed in [facility] hospital lab) *cepheid single result test* Anterior Nasal Swab     Status: None   Collection Time: 07/25/22  3:52 AM   Specimen: Anterior Nasal Swab  Result Value Ref Range   SARS  Coronavirus 2 by RT PCR NEGATIVE NEGATIVE   Imaging Studies: DG Chest 2 View  Result Date: 07/25/2022 CLINICAL DATA:  Cough, chest pain, dyspnea EXAM: CHEST - 2 VIEW COMPARISON:  None Available. FINDINGS: The heart size and mediastinal contours are within normal limits. Both lungs are clear. The visualized skeletal structures are unremarkable. IMPRESSION: No active cardiopulmonary disease. Electronically Signed   By: Fidela Salisbury M.D.   On: 07/25/2022 04:16    ED COURSE and MDM  Nursing notes, initial and subsequent vitals signs, including pulse oximetry, reviewed and interpreted by myself.  Vitals:   07/25/22 0347 07/25/22 0349  BP:  133/77  Pulse:  98  Resp:  (!) 29  Temp:  99 F (37.2 C)  TempSrc:  Oral  SpO2:  96%  Weight: 93 kg   Height: 5\' 7"  (1.702 m)    Medications  chlorpheniramine-HYDROcodone (TUSSIONEX) 10-8 MG/5ML suspension 5 mL (has no administration in time range)  naproxen (NAPROSYN) tablet 500 mg (500 mg Oral Given 07/25/22 0409)   Patient negative for COVID.  His chest pain is consistent with chest wall pain from excessive coughing.  We will treat with naproxen for the pain and Tussionex for the cough.  Presentation consistent with a viral illness.   PROCEDURES  Procedures   ED DIAGNOSES     ICD-10-CM   1. Viral URI with cough  J06.9     2. Chest wall pain  R07.89          Shanon Rosser, MD 07/25/22 0430

## 2022-09-06 ENCOUNTER — Emergency Department (HOSPITAL_BASED_OUTPATIENT_CLINIC_OR_DEPARTMENT_OTHER)
Admission: EM | Admit: 2022-09-06 | Discharge: 2022-09-06 | Disposition: A | Discharge status: Home / Self Care | Payer: Self-pay | Attending: Emergency Medicine | Admitting: Emergency Medicine

## 2022-09-06 ENCOUNTER — Emergency Department (HOSPITAL_BASED_OUTPATIENT_CLINIC_OR_DEPARTMENT_OTHER): Payer: Self-pay | Admitting: Radiology

## 2022-09-06 ENCOUNTER — Other Ambulatory Visit: Payer: Self-pay

## 2022-09-06 ENCOUNTER — Encounter (HOSPITAL_BASED_OUTPATIENT_CLINIC_OR_DEPARTMENT_OTHER): Payer: Self-pay

## 2022-09-06 DIAGNOSIS — R197 Diarrhea, unspecified: Secondary | ICD-10-CM | POA: Insufficient documentation

## 2022-09-06 DIAGNOSIS — Z20822 Contact with and (suspected) exposure to covid-19: Secondary | ICD-10-CM | POA: Insufficient documentation

## 2022-09-06 DIAGNOSIS — J101 Influenza due to other identified influenza virus with other respiratory manifestations: Secondary | ICD-10-CM

## 2022-09-06 LAB — RESP PANEL BY RT-PCR (FLU A&B, COVID) ARPGX2
Influenza A by PCR: POSITIVE — AB
Influenza B by PCR: NEGATIVE
SARS Coronavirus 2 by RT PCR: NEGATIVE

## 2022-09-06 MED ORDER — BENZONATATE 100 MG PO CAPS
100.0000 mg | ORAL_CAPSULE | Freq: Once | ORAL | Status: AC
  Administered 2022-09-06: 100 mg via ORAL
  Filled 2022-09-06: qty 1

## 2022-09-06 MED ORDER — BENZONATATE 100 MG PO CAPS: 100.0000 mg | ORAL_CAPSULE | Freq: Three times a day (TID) | ORAL | 0 refills | Status: DC

## 2022-09-06 NOTE — ED Provider Notes (Signed)
[facility] EMERGENCY DEPT Provider Note   CSN: [id number] Arrival date & time: 09/06/22  1326     History  Chief Complaint  Patient presents with   Cough    Gabriel Fox is a 36 y.o. male.  Patient with no significant past medical history presents to the emergency department for evaluation of fever and cough.  Patient traveled to [place] on 78/29/5621.  On 10/28 he developed cough and aches.  No known sick contacts, suspicious food or water exposures.  He has had some chest pain when he coughs.  Mild sore throat but has been taking Hall's.  States that he was very tired when he returned home last night.  Temperature at home up to 103 F.  Patient reports associated sweating.  No wheezing or shortness of breath.  No skin rashes.  No suspicious food or water exposures.  He has had some diarrhea.  He did take Robitussin last night.       Home Medications Prior to Admission medications   Medication Sig Start Date End Date Taking? Authorizing Provider  chlorpheniramine-HYDROcodone (TUSSIONEX) 10-8 MG/5ML Take 5 mLs by mouth every 12 (twelve) hours as needed for cough. 07/25/22   Molpus, John, MD  Cholecalciferol (VITAMIN D) 125 MCG (5000 UT) CAPS Take 5,000 Units by mouth daily. 10/27/20   Corinne Ports, PA-C  gabapentin (NEURONTIN) 100 MG capsule TAKE 1 CAPSULE (100 MG TOTAL) BY MOUTH THREE TIMES DAILY. 10/27/20 10/27/21  Corinne Ports, PA-C  naproxen (NAPROSYN) 375 MG tablet Take 1 tablet twice daily as needed for chest wall pain. 07/25/22   Molpus, Jenny Reichmann, MD      Allergies    Patient has no known allergies.    Review of Systems   Review of Systems  Physical Exam Updated Vital Signs BP 128/89 (BP Location: Left Arm)   Pulse 93   Temp 98.9 F (37.2 C)   Resp 16   Ht 5\' 7"  (1.702 m)   Wt 93 kg   SpO2 97%   BMI 32.11 kg/m  Physical Exam Vitals and nursing note reviewed.  Constitutional:      Appearance: He is well-developed.  HENT:      Head: Normocephalic and atraumatic.     Jaw: No trismus.     Right Ear: Tympanic membrane, ear canal and external ear normal.     Left Ear: Tympanic membrane, ear canal and external ear normal.     Nose: Nose normal. No mucosal edema or rhinorrhea.     Mouth/Throat:     Mouth: Mucous membranes are not dry.     Pharynx: Uvula midline. No oropharyngeal exudate, posterior oropharyngeal erythema or uvula swelling.     Tonsils: No tonsillar abscesses.  Eyes:     General:        Right eye: No discharge.        Left eye: No discharge.     Conjunctiva/sclera: Conjunctivae normal.  Cardiovascular:     Rate and Rhythm: Normal rate and regular rhythm.     Heart sounds: Normal heart sounds.  Pulmonary:     Effort: Pulmonary effort is normal. No respiratory distress.     Breath sounds: Normal breath sounds. No wheezing or rales.     Comments: Frequent coughing during exam Abdominal:     Palpations: Abdomen is soft.     Tenderness: There is no abdominal tenderness.  Musculoskeletal:     Cervical back: Normal range of motion and neck supple.  Skin:    General: Skin is warm and dry.  Neurological:     Mental Status: He is alert.     ED Results / Procedures / Treatments   Labs (all labs ordered are listed, but only abnormal results are displayed) Labs Reviewed  RESP PANEL BY RT-PCR (FLU A&B, COVID) [id number]    EKG None  Radiology DG Chest 2 View  Result Date: 09/06/2022 CLINICAL DATA:  Shortness of breath, cough EXAM: CHEST - 2 VIEW COMPARISON:  07/25/2022 FINDINGS: The heart size and mediastinal contours are within normal limits. Both lungs are clear. The visualized skeletal structures are unremarkable. IMPRESSION: No active cardiopulmonary disease. Electronically Signed   By: Judie Petit.  Shick M.D.   On: 09/06/2022 13:54    Procedures Procedures    Medications Ordered in ED Medications  benzonatate (TESSALON) capsule 100 mg (has no administration in time range)    ED Course/  Medical Decision Making/ A&P    Patient seen and examined. History obtained directly from patient. Work-up including labs, imaging, EKG ordered in triage, if performed, were reviewed.    Labs/EKG: COVID/flu ordered.  Imaging: Independently visualized and interpreted.  This included: Chest x-ray, agree negative without signs of pneumonia.  Medications/Fluids: Ordered: Tessalon   Most recent vital signs reviewed and are as follows: BP 128/89 (BP Location: Left Arm)   Pulse 93   Temp 98.9 F (37.2 C)   Resp 16   Ht 5\' 7"  (1.702 m)   Wt 93 kg   SpO2 97%   BMI 32.11 kg/m   Initial impression: Flulike illness with cough  2:33 PM Reassessment performed. Patient appears stable.  Coughing less.  Labs personally reviewed and interpreted including: COVID-negative, flu positive  Reviewed pertinent lab work and imaging with patient at bedside. Questions answered.   Most current vital signs reviewed and are as follows: BP 128/89 (BP Location: Left Arm)   Pulse 93   Temp 98.9 F (37.2 C)   Resp 16   Ht 5\' 7"  (1.702 m)   Wt 93 kg   SpO2 97%   BMI 32.11 kg/m   Plan: Discharge to home.   Prescriptions written for: Tessalon  Other home care instructions discussed: Rest, OTC medications  ED return instructions discussed: Worsening shortness of breath, trouble breathing, persistent vomiting  Follow-up instructions discussed: Patient encouraged to follow-up with their PCP in 3 days for recheck if not improving.                          Medical Decision Making Amount and/or Complexity of Data Reviewed Radiology: ordered.  Risk Prescription drug management.   Patient with symptoms consistent with influenza.  Influenza testing positive.  Vitals are stable, low-grade fever. No signs of dehydration, tolerating PO's. Lungs are clear, x-rays negative for pneumonia.  COVID-negative.  No concern for ACS, PE.  Supportive therapy indicated with return if symptoms worsen. Patient  counseled.         Final Clinical Impression(s) / ED Diagnoses Final diagnoses:  Influenza A    Rx / DC Orders ED Discharge Orders          Ordered    benzonatate (TESSALON) 100 MG capsule  Every 8 hours        09/06/22 1432              , PA-C 09/06/22 1434    Renne Crigler, MD 09/09/22 1146

## 2022-09-06 NOTE — ED Triage Notes (Signed)
Patient here POV from Home.  Endorses Recent Travel to [place] and began feeling Ill Sunday. Productive Cough and Aches. Fever Last PM of 103. Associated with some CP as well.   No Sore Throat.  NAD noted during Triage. A&Ox4. GCS 15. Ambulatory.

## 2022-09-06 NOTE — Discharge Instructions (Signed)
Please read and follow all provided instructions.  Your diagnoses today include:  1. Influenza A     Tests performed today include: Chest x-ray: Was negative for pneumonia Flu and COVID testing: Positive for flu A, negative for COVID Vital signs. See below for your results today.   Medications prescribed:  Tessalon Perles - cough suppressant medication  Please use over-the-counter NSAID medications (ibuprofen, naproxen) or Tylenol (acetaminophen) as directed on the packaging for pain -- as long as you do not have any reasons avoid these medications. Reasons to avoid NSAID medications include: weak kidneys, a history of bleeding in your stomach or gut, or uncontrolled high blood pressure or previous heart attack. Reasons to avoid Tylenol include: liver problems or ongoing alcohol use. Never take more than 4000mg  or 8 Extra strength Tylenol in a 24 hour period.     Take any prescribed medications only as directed.  Home care instructions:  Follow any educational materials contained in this packet. Please continue drinking plenty of fluids. Use over-the-counter cold and flu medications as needed as directed on packaging for symptom relief. You may also use ibuprofen or tylenol as directed on packaging for pain or fever.   BE VERY CAREFUL not to take multiple medicines containing Tylenol (also called acetaminophen). Doing so can lead to an overdose which can damage your liver and cause liver failure and possibly death.   Follow-up instructions: Please follow-up with your primary care provider in the next 3 days for further evaluation of your symptoms if not improving.   Return instructions:  Please return to the Emergency Department if you experience worsening symptoms. Please return if you have a high fever greater than 101 degrees not controlled with over-the-counter medications, persistent vomiting and cannot keep down fluids, or worsening trouble breathing. Please return if you have any  other emergent concerns.  Additional Information:  Your vital signs today were: BP 128/89 (BP Location: Left Arm)   Pulse 93   Temp 98.9 F (37.2 C)   Resp 16   Ht 5\' 7"  (1.702 m)   Wt 93 kg   SpO2 97%   BMI 32.11 kg/m  If your blood pressure (BP) was elevated above 135/85 this visit, please have this repeated by your doctor within one month.

## 2022-10-23 ENCOUNTER — Ambulatory Visit (INDEPENDENT_AMBULATORY_CARE_PROVIDER_SITE_OTHER): Payer: Self-pay | Admitting: Family Medicine

## 2022-10-23 ENCOUNTER — Encounter (HOSPITAL_BASED_OUTPATIENT_CLINIC_OR_DEPARTMENT_OTHER): Payer: Self-pay | Admitting: Family Medicine

## 2022-10-23 VITALS — BP 135/76 | HR 70 | Temp 97.6°F | Ht 67.0 in | Wt 213.2 lb

## 2022-10-23 DIAGNOSIS — Z23 Encounter for immunization: Secondary | ICD-10-CM

## 2022-10-23 DIAGNOSIS — K219 Gastro-esophageal reflux disease without esophagitis: Secondary | ICD-10-CM
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
## 2022-10-25 DIAGNOSIS — Z23 Encounter for immunization: Secondary | ICD-10-CM

## 2022-10-27 DIAGNOSIS — K219 Gastro-esophageal reflux disease without esophagitis: Secondary | ICD-10-CM | POA: Insufficient documentation

## 2022-10-27 MED ORDER — OMEPRAZOLE 20 MG PO CPDR: 20.0000 mg | DELAYED_RELEASE_CAPSULE | Freq: Every day | ORAL | 2 refills | Status: AC

## 2022-10-27 NOTE — Assessment & Plan Note (Addendum)
Symptoms most consistent with underlying reflux disease.  We will proceed with initial treatment utilizing PPI.  Instructed on proper use and administration of medication We will plan for follow-up in about 6 to 8 weeks to assess progress with symptoms Also discussed lifestyle modifications and provided handout

## 2022-11-01 DIAGNOSIS — R0602 Shortness of breath: Secondary | ICD-10-CM | POA: Insufficient documentation

## 2022-11-01 DIAGNOSIS — M549 Dorsalgia, unspecified: Secondary | ICD-10-CM | POA: Insufficient documentation

## 2022-11-01 DIAGNOSIS — Z5321 Procedure and treatment not carried out due to patient leaving prior to being seen by health care provider: Secondary | ICD-10-CM | POA: Insufficient documentation

## 2022-11-02 ENCOUNTER — Other Ambulatory Visit: Payer: Self-pay

## 2022-11-02 ENCOUNTER — Emergency Department (HOSPITAL_COMMUNITY): Payer: Self-pay

## 2022-11-02 ENCOUNTER — Emergency Department (HOSPITAL_COMMUNITY)
Admission: EM | Admit: 2022-11-02 | Discharge: 2022-11-02 | Discharge status: Against Medical Advice | Payer: Self-pay | Attending: Physician Assistant | Admitting: Physician Assistant

## 2022-11-02 LAB — CBC WITH DIFFERENTIAL/PLATELET
Abs Immature Granulocytes: 0.01 10*3/uL (ref 0.00–0.07)
Basophils Absolute: 0 10*3/uL (ref 0.0–0.1)
Basophils Relative: 1 %
Eosinophils Absolute: 0 10*3/uL (ref 0.0–0.5)
Eosinophils Relative: 1 %
HCT: 44 % (ref 39.0–52.0)
Hemoglobin: 13.7 g/dL (ref 13.0–17.0)
Immature Granulocytes: 0 %
Lymphocytes Relative: 44 %
Lymphs Abs: 2.1 10*3/uL (ref 0.7–4.0)
MCH: 24.5 pg — ABNORMAL LOW (ref 26.0–34.0)
MCHC: 31.1 g/dL (ref 30.0–36.0)
MCV: 78.7 fL — ABNORMAL LOW (ref 80.0–100.0)
Monocytes Absolute: 0.4 10*3/uL (ref 0.1–1.0)
Monocytes Relative: 10 %
Neutro Abs: 2 10*3/uL (ref 1.7–7.7)
Neutrophils Relative %: 44 %
Platelets: 258 10*3/uL (ref 150–400)
RBC: 5.59 MIL/uL (ref 4.22–5.81)
RDW: 15.6 % — ABNORMAL HIGH (ref 11.5–15.5)
WBC: 4.6 10*3/uL (ref 4.0–10.5)
nRBC: 0 % (ref 0.0–0.2)

## 2022-11-02 LAB — BASIC METABOLIC PANEL
Anion gap: 6 (ref 5–15)
BUN: 11 mg/dL (ref 6–20)
CO2: 24 mmol/L (ref 22–32)
Calcium: 9.5 mg/dL (ref 8.9–10.3)
Chloride: 108 mmol/L (ref 98–111)
Creatinine, Ser: 1.19 mg/dL (ref 0.61–1.24)
GFR, Estimated: >60 mL/min (ref >60)
Glucose, Bld: 97 mg/dL (ref 70–99)
Potassium: 4.1 mmol/L (ref 3.5–5.1)
Sodium: 138 mmol/L (ref 135–145)

## 2022-11-02 LAB — TROPONIN I (HIGH SENSITIVITY)
Troponin I (High Sensitivity): <2 ng/L (ref <18)
Troponin I (High Sensitivity): <2 ng/L (ref <18)

## 2022-11-02 NOTE — ED Triage Notes (Signed)
Patient reports right back pain onset this morning , denies injury , pain increases with deep inspiration .

## 2022-11-02 NOTE — ED Provider Triage Note (Signed)
Emergency Medicine Provider Triage Evaluation Note  Gabriel Fox , a 36 y.o. male  was evaluated in triage.  Pt complains of right posterior rib pain.  Worse with deep breathing.  Some shortness of breath.  Worse with movement, breathing.  No lower extremity.  No history of PE or DVT.  Review of Systems  Positive: SOB, right posterior back pain Negative:   Physical Exam  BP (!) 134/109 (BP Location: Right Arm)   Pulse 81   Temp 97.9 F (36.6 C)   Resp 18   SpO2 100%  Gen:   Awake, no distress   Resp:  Normal effort  MSK:   Moves extremities without difficulty  Other:    Medical Decision Making  Medically screening exam initiated at 12:48 AM.  Appropriate orders placed.  Sharolyn Douglas was informed that the remainder of the evaluation will be completed by another provider, this initial triage assessment does not replace that evaluation, and the importance of remaining in the ED until their evaluation is complete.  SOB, right post back pain   Shaolin Armas A, PA-C 11/02/22 0049

## 2022-11-02 NOTE — ED Notes (Signed)
Pt called x3 for vitals no answer. 

## 2022-12-04 ENCOUNTER — Ambulatory Visit (HOSPITAL_BASED_OUTPATIENT_CLINIC_OR_DEPARTMENT_OTHER): Payer: Self-pay | Admitting: Family Medicine

## 2024-07-22 ENCOUNTER — Emergency Department (HOSPITAL_COMMUNITY): Payer: Self-pay

## 2024-07-22 ENCOUNTER — Emergency Department (HOSPITAL_COMMUNITY)
Admission: EM | Admit: 2024-07-22 | Discharge: 2024-07-22 | Disposition: A | Discharge status: Home / Self Care | Payer: Self-pay | Attending: Emergency Medicine | Admitting: Emergency Medicine

## 2024-07-22 DIAGNOSIS — S0990XA Unspecified injury of head, initial encounter: Secondary | ICD-10-CM | POA: Insufficient documentation

## 2024-07-22 DIAGNOSIS — Z79899 Other long term (current) drug therapy: Secondary | ICD-10-CM | POA: Insufficient documentation

## 2024-07-22 DIAGNOSIS — S3991XA Unspecified injury of abdomen, initial encounter: Secondary | ICD-10-CM | POA: Diagnosis not present

## 2024-07-22 DIAGNOSIS — S40021A Contusion of right upper arm, initial encounter: Secondary | ICD-10-CM | POA: Diagnosis not present

## 2024-07-22 DIAGNOSIS — S20211A Contusion of right front wall of thorax, initial encounter: Secondary | ICD-10-CM | POA: Insufficient documentation

## 2024-07-22 DIAGNOSIS — Y92410 Unspecified street and highway as the place of occurrence of the external cause: Secondary | ICD-10-CM | POA: Insufficient documentation

## 2024-07-22 DIAGNOSIS — S299XXA Unspecified injury of thorax, initial encounter: Secondary | ICD-10-CM | POA: Diagnosis present

## 2024-07-22 LAB — I-STAT CG4 LACTIC ACID, ED: Lactic Acid, Venous: 2.3 mmol/L (ref 0.5–1.9)

## 2024-07-22 LAB — I-STAT CHEM 8, ED
BUN: 9 mg/dL (ref 6–20)
Calcium, Ion: 1.09 mmol/L — ABNORMAL LOW (ref 1.15–1.40)
Chloride: 109 mmol/L (ref 98–111)
Creatinine, Ser: 1.3 mg/dL — ABNORMAL HIGH (ref 0.61–1.24)
Glucose, Bld: 113 mg/dL — ABNORMAL HIGH (ref 70–99)
HCT: 42 % (ref 39.0–52.0)
Hemoglobin: 14.3 g/dL (ref 13.0–17.0)
Potassium: 3.6 mmol/L (ref 3.5–5.1)
Sodium: 141 mmol/L (ref 135–145)
TCO2: 20 mmol/L — ABNORMAL LOW (ref 22–32)

## 2024-07-22 LAB — URINALYSIS, ROUTINE W REFLEX MICROSCOPIC
Bilirubin Urine: NEGATIVE
Glucose, UA: NEGATIVE mg/dL
Hgb urine dipstick: NEGATIVE
Ketones, ur: NEGATIVE mg/dL
Leukocytes,Ua: NEGATIVE
Nitrite: NEGATIVE
Protein, ur: NEGATIVE mg/dL
Specific Gravity, Urine: 1.018 (ref 1.005–1.030)
pH: 6 (ref 5.0–8.0)

## 2024-07-22 LAB — RAPID URINE DRUG SCREEN, HOSP PERFORMED
Amphetamines: NOT DETECTED
Barbiturates: NOT DETECTED
Benzodiazepines: NOT DETECTED
Cocaine: NOT DETECTED
Opiates: NOT DETECTED
Tetrahydrocannabinol: POSITIVE — AB

## 2024-07-22 LAB — COMPREHENSIVE METABOLIC PANEL WITH GFR
ALT: 31 U/L (ref 0–44)
AST: 27 U/L (ref 15–41)
Albumin: 4.1 g/dL (ref 3.5–5.0)
Alkaline Phosphatase: 52 U/L (ref 38–126)
Anion gap: 13 (ref 5–15)
BUN: 9 mg/dL (ref 6–20)
CO2: 19 mmol/L — ABNORMAL LOW (ref 22–32)
Calcium: 9.2 mg/dL (ref 8.9–10.3)
Chloride: 106 mmol/L (ref 98–111)
Creatinine, Ser: 1.26 mg/dL — ABNORMAL HIGH (ref 0.61–1.24)
GFR, Estimated: >60 mL/min (ref >60)
Glucose, Bld: 114 mg/dL — ABNORMAL HIGH (ref 70–99)
Potassium: 3.6 mmol/L (ref 3.5–5.1)
Sodium: 138 mmol/L (ref 135–145)
Total Bilirubin: 0.7 mg/dL (ref 0.0–1.2)
Total Protein: 7.5 g/dL (ref 6.5–8.1)

## 2024-07-22 LAB — CBC
HCT: 42.9 % (ref 39.0–52.0)
Hemoglobin: 13.4 g/dL (ref 13.0–17.0)
MCH: 24.2 pg — ABNORMAL LOW (ref 26.0–34.0)
MCHC: 31.2 g/dL (ref 30.0–36.0)
MCV: 77.4 fL — ABNORMAL LOW (ref 80.0–100.0)
Platelets: 244 K/uL (ref 150–400)
RBC: 5.54 MIL/uL (ref 4.22–5.81)
RDW: 14.9 % (ref 11.5–15.5)
WBC: 5.1 K/uL (ref 4.0–10.5)
nRBC: 0 % (ref 0.0–0.2)

## 2024-07-22 LAB — PROTIME-INR
INR: 1 (ref 0.8–1.2)
Prothrombin Time: 13.9 s (ref 11.4–15.2)

## 2024-07-22 LAB — ETHANOL: Alcohol, Ethyl (B): <15 mg/dL (ref <15)

## 2024-07-22 MED ORDER — FENTANYL CITRATE PF 50 MCG/ML IJ SOSY
50.0000 ug | PREFILLED_SYRINGE | Freq: Once | INTRAMUSCULAR | Status: AC
  Administered 2024-07-22: 50 ug via INTRAVENOUS
  Filled 2024-07-22: qty 1

## 2024-07-22 MED ORDER — IBUPROFEN 800 MG PO TABS: 800.0000 mg | ORAL_TABLET | Freq: Three times a day (TID) | ORAL | 0 refills | Status: AC | PRN

## 2024-07-22 MED ORDER — IBUPROFEN 800 MG PO TABS
800.0000 mg | ORAL_TABLET | Freq: Once | ORAL | Status: AC
  Administered 2024-07-22: 800 mg via ORAL
  Filled 2024-07-22: qty 1

## 2024-07-22 MED ORDER — IOHEXOL 350 MG/ML SOLN
75.0000 mL | Freq: Once | INTRAVENOUS | Status: AC | PRN
  Administered 2024-07-22: 75 mL via INTRAVENOUS

## 2024-07-22 NOTE — ED Notes (Signed)
 C-collar being changed by TRN
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
## 2024-07-22 NOTE — Discharge Instructions (Addendum)
 Follow-up with orthopedist for recheck if pain persist.
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
